# Patient Record
Sex: Female | Born: 1980 | Race: Black or African American | Hispanic: No | Marital: Single | Smoking: Former smoker
Health system: Southern US, Community
[De-identification: ages and names within clinical notes are randomized; demographics above are authoritative.]

## PROBLEM LIST (undated history)

## (undated) DIAGNOSIS — K219 Gastro-esophageal reflux disease without esophagitis: Secondary | ICD-10-CM

## (undated) HISTORY — PX: HEMORRHOID SURGERY: SHX153

---
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
## 2001-09-14 ENCOUNTER — Emergency Department (HOSPITAL_COMMUNITY): Admission: EM | Admit: 2001-09-14 | Discharge: 2001-09-14 | Payer: Self-pay | Admitting: Emergency Medicine

## 2001-12-04 ENCOUNTER — Emergency Department (HOSPITAL_COMMUNITY): Admission: EM | Admit: 2001-12-04 | Discharge: 2001-12-05 | Payer: Self-pay | Admitting: Emergency Medicine

## 2001-12-07 ENCOUNTER — Emergency Department (HOSPITAL_COMMUNITY): Admission: EM | Admit: 2001-12-07 | Discharge: 2001-12-08 | Payer: Self-pay | Admitting: Emergency Medicine

## 2001-12-19 ENCOUNTER — Emergency Department (HOSPITAL_COMMUNITY): Admission: EM | Admit: 2001-12-19 | Discharge: 2001-12-19 | Payer: Self-pay | Admitting: Emergency Medicine

## 2001-12-23 ENCOUNTER — Encounter: Payer: Self-pay | Admitting: Emergency Medicine

## 2001-12-23 ENCOUNTER — Emergency Department (HOSPITAL_COMMUNITY): Admission: EM | Admit: 2001-12-23 | Discharge: 2001-12-23 | Payer: Self-pay | Admitting: Emergency Medicine

## 2002-01-19 ENCOUNTER — Emergency Department (HOSPITAL_COMMUNITY): Admission: EM | Admit: 2002-01-19 | Discharge: 2002-01-19 | Payer: Self-pay | Admitting: Emergency Medicine

## 2003-04-19 ENCOUNTER — Emergency Department (HOSPITAL_COMMUNITY): Admission: EM | Admit: 2003-04-19 | Discharge: 2003-04-19 | Payer: Self-pay | Admitting: Emergency Medicine

## 2003-05-22 ENCOUNTER — Emergency Department (HOSPITAL_COMMUNITY): Admission: EM | Admit: 2003-05-22 | Discharge: 2003-05-22 | Payer: Self-pay | Admitting: Emergency Medicine

## 2003-05-28 ENCOUNTER — Emergency Department (HOSPITAL_COMMUNITY): Admission: EM | Admit: 2003-05-28 | Discharge: 2003-05-28 | Payer: Self-pay | Admitting: Emergency Medicine

## 2003-06-01 ENCOUNTER — Ambulatory Visit (HOSPITAL_COMMUNITY): Admission: RE | Admit: 2003-06-01 | Discharge: 2003-06-01 | Payer: Self-pay | Admitting: Emergency Medicine

## 2003-06-02 ENCOUNTER — Ambulatory Visit (HOSPITAL_COMMUNITY): Admission: RE | Admit: 2003-06-02 | Discharge: 2003-06-02 | Payer: Self-pay | Admitting: Family Medicine

## 2003-07-21 ENCOUNTER — Emergency Department (HOSPITAL_COMMUNITY): Admission: EM | Admit: 2003-07-21 | Discharge: 2003-07-22 | Payer: Self-pay | Admitting: Emergency Medicine

## 2003-10-14 ENCOUNTER — Emergency Department (HOSPITAL_COMMUNITY): Admission: EM | Admit: 2003-10-14 | Discharge: 2003-10-14 | Payer: Self-pay | Admitting: Emergency Medicine

## 2003-10-20 ENCOUNTER — Emergency Department (HOSPITAL_COMMUNITY): Admission: EM | Admit: 2003-10-20 | Discharge: 2003-10-20 | Payer: Self-pay | Admitting: Emergency Medicine

## 2003-12-08 ENCOUNTER — Inpatient Hospital Stay (HOSPITAL_COMMUNITY): Admission: AD | Admit: 2003-12-08 | Discharge: 2003-12-08 | Payer: Self-pay | Admitting: Obstetrics

## 2004-06-27 ENCOUNTER — Emergency Department (HOSPITAL_COMMUNITY): Admission: EM | Admit: 2004-06-27 | Discharge: 2004-06-27 | Payer: Self-pay | Admitting: Emergency Medicine

## 2004-08-05 ENCOUNTER — Emergency Department (HOSPITAL_COMMUNITY): Admission: EM | Admit: 2004-08-05 | Discharge: 2004-08-05 | Payer: Self-pay | Admitting: Emergency Medicine

## 2004-12-02 ENCOUNTER — Encounter: Payer: Self-pay | Admitting: Emergency Medicine

## 2004-12-03 ENCOUNTER — Ambulatory Visit: Payer: Self-pay | Admitting: Obstetrics and Gynecology

## 2004-12-03 ENCOUNTER — Inpatient Hospital Stay (HOSPITAL_COMMUNITY): Admission: AD | Admit: 2004-12-03 | Discharge: 2004-12-04 | Payer: Self-pay | Admitting: Obstetrics and Gynecology

## 2005-02-22 ENCOUNTER — Emergency Department (HOSPITAL_COMMUNITY): Admission: EM | Admit: 2005-02-22 | Discharge: 2005-02-22 | Payer: Self-pay | Admitting: Emergency Medicine

## 2005-04-30 ENCOUNTER — Emergency Department (HOSPITAL_COMMUNITY): Admission: EM | Admit: 2005-04-30 | Discharge: 2005-04-30 | Payer: Self-pay | Admitting: Emergency Medicine

## 2006-01-23 ENCOUNTER — Emergency Department (HOSPITAL_COMMUNITY): Admission: EM | Admit: 2006-01-23 | Discharge: 2006-01-23 | Payer: Self-pay | Admitting: Emergency Medicine

## 2006-02-14 ENCOUNTER — Emergency Department (HOSPITAL_COMMUNITY): Admission: EM | Admit: 2006-02-14 | Discharge: 2006-02-14 | Payer: Self-pay | Admitting: Emergency Medicine

## 2006-08-16 ENCOUNTER — Encounter (INDEPENDENT_AMBULATORY_CARE_PROVIDER_SITE_OTHER): Payer: Self-pay | Admitting: Nurse Practitioner

## 2006-08-23 ENCOUNTER — Ambulatory Visit: Payer: Self-pay | Admitting: Internal Medicine

## 2006-08-23 ENCOUNTER — Encounter (INDEPENDENT_AMBULATORY_CARE_PROVIDER_SITE_OTHER): Payer: Self-pay | Admitting: Nurse Practitioner

## 2006-08-23 LAB — CONVERTED CEMR LAB
Calcium: 8.9 mg/dL
Chloride: 107 meq/L
HCT: 39.4 %
MCHC: 32.7 g/dL
MCV: 90 fL
Platelets: 172 10*3/uL
Potassium: 4.2 meq/L
VLDL: 14 mg/dL

## 2006-09-11 ENCOUNTER — Ambulatory Visit: Payer: Self-pay | Admitting: Internal Medicine

## 2006-09-11 ENCOUNTER — Other Ambulatory Visit: Admission: RE | Admit: 2006-09-11 | Discharge: 2006-09-11 | Payer: Self-pay | Admitting: Family Medicine

## 2006-09-11 ENCOUNTER — Encounter (INDEPENDENT_AMBULATORY_CARE_PROVIDER_SITE_OTHER): Payer: Self-pay | Admitting: Nurse Practitioner

## 2006-09-11 LAB — CONVERTED CEMR LAB
GC Probe Amp, Genital: NEGATIVE
Pap Smear: NEGATIVE

## 2006-09-21 ENCOUNTER — Encounter: Payer: Self-pay | Admitting: Nurse Practitioner

## 2006-09-23 DIAGNOSIS — K219 Gastro-esophageal reflux disease without esophagitis: Secondary | ICD-10-CM

## 2006-10-28 ENCOUNTER — Telehealth (INDEPENDENT_AMBULATORY_CARE_PROVIDER_SITE_OTHER): Payer: Self-pay | Admitting: Nurse Practitioner

## 2006-12-23 ENCOUNTER — Ambulatory Visit: Payer: Self-pay | Admitting: Nurse Practitioner

## 2006-12-23 DIAGNOSIS — S335XXA Sprain of ligaments of lumbar spine, initial encounter: Secondary | ICD-10-CM

## 2007-01-15 ENCOUNTER — Ambulatory Visit: Payer: Self-pay | Admitting: Nurse Practitioner

## 2007-02-11 ENCOUNTER — Emergency Department (HOSPITAL_COMMUNITY): Admission: EM | Admit: 2007-02-11 | Discharge: 2007-02-11 | Payer: Self-pay | Admitting: Emergency Medicine

## 2007-04-21 ENCOUNTER — Emergency Department (HOSPITAL_COMMUNITY): Admission: EM | Admit: 2007-04-21 | Discharge: 2007-04-21 | Payer: Self-pay | Admitting: Emergency Medicine

## 2007-05-28 ENCOUNTER — Ambulatory Visit (HOSPITAL_COMMUNITY): Admission: RE | Admit: 2007-05-28 | Discharge: 2007-05-28 | Payer: Self-pay | Admitting: Internal Medicine

## 2007-06-12 ENCOUNTER — Telehealth (INDEPENDENT_AMBULATORY_CARE_PROVIDER_SITE_OTHER): Payer: Self-pay | Admitting: Nurse Practitioner

## 2007-06-15 ENCOUNTER — Emergency Department (HOSPITAL_COMMUNITY): Admission: EM | Admit: 2007-06-15 | Discharge: 2007-06-15 | Payer: Self-pay | Admitting: Emergency Medicine

## 2007-06-16 ENCOUNTER — Emergency Department (HOSPITAL_COMMUNITY): Admission: EM | Admit: 2007-06-16 | Discharge: 2007-06-17 | Payer: Self-pay | Admitting: Emergency Medicine

## 2007-09-14 ENCOUNTER — Emergency Department (HOSPITAL_COMMUNITY): Admission: EM | Admit: 2007-09-14 | Discharge: 2007-09-14 | Payer: Self-pay | Admitting: Emergency Medicine

## 2007-09-25 ENCOUNTER — Ambulatory Visit: Payer: Self-pay | Admitting: Nurse Practitioner

## 2007-09-25 DIAGNOSIS — L910 Hypertrophic scar: Secondary | ICD-10-CM

## 2007-12-10 ENCOUNTER — Emergency Department (HOSPITAL_COMMUNITY): Admission: EM | Admit: 2007-12-10 | Discharge: 2007-12-10 | Payer: Self-pay | Admitting: Family Medicine

## 2007-12-18 ENCOUNTER — Encounter (INDEPENDENT_AMBULATORY_CARE_PROVIDER_SITE_OTHER): Payer: Self-pay | Admitting: Nurse Practitioner

## 2007-12-18 ENCOUNTER — Ambulatory Visit: Payer: Self-pay | Admitting: Nurse Practitioner

## 2007-12-18 ENCOUNTER — Other Ambulatory Visit: Admission: RE | Admit: 2007-12-18 | Discharge: 2007-12-18 | Payer: Self-pay | Admitting: Internal Medicine

## 2007-12-18 LAB — CONVERTED CEMR LAB
Albumin: 4.5 g/dL (ref 3.5–5.2)
Alkaline Phosphatase: 46 units/L (ref 39–117)
BUN: 7 mg/dL (ref 6–23)
Basophils Relative: 0 % (ref 0–1)
Beta hcg, urine, semiquantitative: NEGATIVE
Bilirubin Urine: NEGATIVE
Blood in Urine, dipstick: NEGATIVE
CO2: 23 meq/L (ref 19–32)
Calcium: 9.6 mg/dL (ref 8.4–10.5)
Chlamydia, DNA Probe: NEGATIVE
Chloride: 104 meq/L (ref 96–112)
Cholesterol: 185 mg/dL (ref 0–200)
Creatinine, Ser: 0.76 mg/dL (ref 0.40–1.20)
Glucose, Bld: 95 mg/dL (ref 70–99)
Glucose, Urine, Semiquant: NEGATIVE
HDL: 40 mg/dL (ref >39)
Hemoglobin: 13.5 g/dL (ref 12.0–15.0)
Ketones, urine, test strip: NEGATIVE
Lymphs Abs: 2.1 10*3/uL (ref 0.7–4.0)
MCV: 86.7 fL (ref 78.0–100.0)
Potassium: 4.2 meq/L (ref 3.5–5.3)
RBC: 4.59 M/uL (ref 3.87–5.11)
Sodium: 138 meq/L (ref 135–145)
Specific Gravity, Urine: 1.015
Total Bilirubin: 0.3 mg/dL (ref 0.3–1.2)
Total CHOL/HDL Ratio: 4.6
Total Protein: 7.6 g/dL (ref 6.0–8.3)
Urobilinogen, UA: 0.2

## 2007-12-19 ENCOUNTER — Encounter (INDEPENDENT_AMBULATORY_CARE_PROVIDER_SITE_OTHER): Payer: Self-pay | Admitting: Nurse Practitioner

## 2007-12-30 ENCOUNTER — Encounter (INDEPENDENT_AMBULATORY_CARE_PROVIDER_SITE_OTHER): Payer: Self-pay | Admitting: *Deleted

## 2008-01-05 ENCOUNTER — Telehealth (INDEPENDENT_AMBULATORY_CARE_PROVIDER_SITE_OTHER): Payer: Self-pay | Admitting: Nurse Practitioner

## 2008-01-06 ENCOUNTER — Emergency Department (HOSPITAL_COMMUNITY): Admission: EM | Admit: 2008-01-06 | Discharge: 2008-01-06 | Payer: Self-pay | Admitting: Family Medicine

## 2008-01-21 ENCOUNTER — Emergency Department (HOSPITAL_COMMUNITY): Admission: EM | Admit: 2008-01-21 | Discharge: 2008-01-21 | Payer: Self-pay | Admitting: Emergency Medicine

## 2008-03-01 ENCOUNTER — Emergency Department (HOSPITAL_COMMUNITY): Admission: EM | Admit: 2008-03-01 | Discharge: 2008-03-01 | Payer: Self-pay | Admitting: Emergency Medicine

## 2008-05-04 ENCOUNTER — Emergency Department (HOSPITAL_COMMUNITY): Admission: EM | Admit: 2008-05-04 | Discharge: 2008-05-04 | Payer: Self-pay | Admitting: Family Medicine

## 2008-06-11 ENCOUNTER — Telehealth (INDEPENDENT_AMBULATORY_CARE_PROVIDER_SITE_OTHER): Payer: Self-pay | Admitting: Nurse Practitioner

## 2008-06-17 ENCOUNTER — Ambulatory Visit: Payer: Self-pay | Admitting: Nurse Practitioner

## 2008-06-17 DIAGNOSIS — F329 Major depressive disorder, single episode, unspecified: Secondary | ICD-10-CM | POA: Insufficient documentation

## 2008-06-23 ENCOUNTER — Encounter (INDEPENDENT_AMBULATORY_CARE_PROVIDER_SITE_OTHER): Payer: Self-pay | Admitting: Nurse Practitioner

## 2008-09-16 ENCOUNTER — Emergency Department (HOSPITAL_COMMUNITY): Admission: EM | Admit: 2008-09-16 | Discharge: 2008-09-16 | Payer: Self-pay | Admitting: Family Medicine

## 2008-09-23 ENCOUNTER — Inpatient Hospital Stay (HOSPITAL_COMMUNITY): Admission: AD | Admit: 2008-09-23 | Discharge: 2008-09-23 | Payer: Self-pay | Admitting: Obstetrics & Gynecology

## 2008-12-23 ENCOUNTER — Telehealth (INDEPENDENT_AMBULATORY_CARE_PROVIDER_SITE_OTHER): Payer: Self-pay | Admitting: Nurse Practitioner

## 2009-01-13 ENCOUNTER — Emergency Department (HOSPITAL_COMMUNITY): Admission: EM | Admit: 2009-01-13 | Discharge: 2009-01-13 | Payer: Self-pay | Admitting: Emergency Medicine

## 2009-01-18 ENCOUNTER — Encounter (INDEPENDENT_AMBULATORY_CARE_PROVIDER_SITE_OTHER): Payer: Self-pay | Admitting: *Deleted

## 2009-03-26 ENCOUNTER — Emergency Department (HOSPITAL_COMMUNITY): Admission: EM | Admit: 2009-03-26 | Discharge: 2009-03-26 | Payer: Self-pay | Admitting: Emergency Medicine

## 2009-05-25 ENCOUNTER — Emergency Department (HOSPITAL_COMMUNITY): Admission: EM | Admit: 2009-05-25 | Discharge: 2009-05-26 | Payer: Self-pay | Admitting: Emergency Medicine

## 2009-07-13 ENCOUNTER — Emergency Department (HOSPITAL_COMMUNITY): Admission: EM | Admit: 2009-07-13 | Discharge: 2009-07-14 | Payer: Self-pay | Admitting: Emergency Medicine

## 2009-07-28 ENCOUNTER — Emergency Department (HOSPITAL_COMMUNITY): Admission: EM | Admit: 2009-07-28 | Discharge: 2009-07-28 | Payer: Self-pay | Admitting: Emergency Medicine

## 2009-08-03 ENCOUNTER — Emergency Department (HOSPITAL_COMMUNITY): Admission: EM | Admit: 2009-08-03 | Discharge: 2009-08-03 | Payer: Self-pay | Admitting: Emergency Medicine

## 2009-08-07 ENCOUNTER — Emergency Department (HOSPITAL_COMMUNITY): Admission: EM | Admit: 2009-08-07 | Discharge: 2009-08-08 | Payer: Self-pay | Admitting: Emergency Medicine

## 2010-02-06 ENCOUNTER — Emergency Department (HOSPITAL_COMMUNITY)
Admission: EM | Admit: 2010-02-06 | Discharge: 2010-02-07 | Payer: Self-pay | Source: Home / Self Care | Admitting: Emergency Medicine

## 2010-03-05 ENCOUNTER — Encounter: Payer: Self-pay | Admitting: Emergency Medicine

## 2010-04-24 LAB — CBC
HCT: 35.1 % — ABNORMAL LOW (ref 36.0–46.0)
MCH: 26.5 pg (ref 26.0–34.0)
MCV: 81.6 fL (ref 78.0–100.0)
Platelets: 193 10*3/uL (ref 150–400)
RBC: 4.3 MIL/uL (ref 3.87–5.11)
WBC: 6.2 10*3/uL (ref 4.0–10.5)

## 2010-04-24 LAB — BASIC METABOLIC PANEL
CO2: 26 mEq/L (ref 19–32)
Calcium: 9.2 mg/dL (ref 8.4–10.5)
Chloride: 104 mEq/L (ref 96–112)
Glucose, Bld: 96 mg/dL (ref 70–99)

## 2010-04-24 LAB — URINALYSIS, ROUTINE W REFLEX MICROSCOPIC
Glucose, UA: NEGATIVE mg/dL
Hgb urine dipstick: NEGATIVE

## 2010-04-24 LAB — DIFFERENTIAL
Basophils Absolute: 0 10*3/uL (ref 0.0–0.1)
Basophils Relative: 0 % (ref 0–1)
Eosinophils Relative: 1 % (ref 0–5)
Lymphs Abs: 1.8 10*3/uL (ref 0.7–4.0)
Monocytes Absolute: 0.5 10*3/uL (ref 0.1–1.0)
Monocytes Relative: 8 % (ref 3–12)
Neutro Abs: 3.9 10*3/uL (ref 1.7–7.7)
Neutrophils Relative %: 62 % (ref 43–77)

## 2010-05-01 LAB — URINALYSIS, ROUTINE W REFLEX MICROSCOPIC
Ketones, ur: NEGATIVE mg/dL
Ketones, ur: NEGATIVE mg/dL
Nitrite: NEGATIVE
Protein, ur: NEGATIVE mg/dL
Protein, ur: NEGATIVE mg/dL
pH: 5.5 (ref 5.0–8.0)

## 2010-05-01 LAB — COMPREHENSIVE METABOLIC PANEL
ALT: 11 U/L (ref 0–35)
Calcium: 9.2 mg/dL (ref 8.4–10.5)
Creatinine, Ser: 0.94 mg/dL (ref 0.4–1.2)
GFR calc Af Amer: >60 mL/min (ref >60)
Glucose, Bld: 88 mg/dL (ref 70–99)
Sodium: 139 mEq/L (ref 135–145)
Total Protein: 7 g/dL (ref 6.0–8.3)

## 2010-05-01 LAB — HEMOCCULT GUIAC POC 1CARD (OFFICE): Fecal Occult Bld: POSITIVE

## 2010-05-01 LAB — DIFFERENTIAL
Eosinophils Absolute: 0.1 10*3/uL (ref 0.0–0.7)
Lymphocytes Relative: 23 % (ref 12–46)
Lymphs Abs: 1.6 10*3/uL (ref 0.7–4.0)
Monocytes Relative: 9 % (ref 3–12)
Neutrophils Relative %: 67 % (ref 43–77)

## 2010-05-01 LAB — CBC
MCHC: 33.5 g/dL (ref 30.0–36.0)
MCV: 91.6 fL (ref 78.0–100.0)
RDW: 13.6 % (ref 11.5–15.5)

## 2010-05-01 LAB — POCT PREGNANCY, URINE: Preg Test, Ur: NEGATIVE

## 2010-05-03 LAB — DIFFERENTIAL
Basophils Absolute: 0.1 10*3/uL (ref 0.0–0.1)
Basophils Relative: 2 % — ABNORMAL HIGH (ref 0–1)
Lymphocytes Relative: 19 % (ref 12–46)
Monocytes Absolute: 0.6 10*3/uL (ref 0.1–1.0)
Monocytes Relative: 9 % (ref 3–12)
Neutro Abs: 5 10*3/uL (ref 1.7–7.7)
Neutrophils Relative %: 70 % (ref 43–77)

## 2010-05-03 LAB — URINALYSIS, ROUTINE W REFLEX MICROSCOPIC
Glucose, UA: NEGATIVE mg/dL
Ketones, ur: 15 mg/dL — AB
Leukocytes, UA: NEGATIVE
Nitrite: NEGATIVE
Specific Gravity, Urine: 1.03 (ref 1.005–1.030)
pH: 6.5 (ref 5.0–8.0)

## 2010-05-03 LAB — POCT PREGNANCY, URINE: Preg Test, Ur: NEGATIVE

## 2010-05-03 LAB — ETHANOL: Alcohol, Ethyl (B): <5 mg/dL (ref 0–10)

## 2010-05-03 LAB — ACETAMINOPHEN LEVEL
Acetaminophen (Tylenol), Serum: <10 ug/mL — ABNORMAL LOW (ref 10–30)
Acetaminophen (Tylenol), Serum: <10 ug/mL — ABNORMAL LOW (ref 10–30)

## 2010-05-03 LAB — BASIC METABOLIC PANEL
Calcium: 9.3 mg/dL (ref 8.4–10.5)
Creatinine, Ser: 1.01 mg/dL (ref 0.4–1.2)
GFR calc Af Amer: >60 mL/min (ref >60)
GFR calc non Af Amer: >60 mL/min (ref >60)
Sodium: 139 mEq/L (ref 135–145)

## 2010-05-03 LAB — POCT I-STAT, CHEM 8
Creatinine, Ser: 1 mg/dL (ref 0.4–1.2)
HCT: 39 % (ref 36.0–46.0)
Hemoglobin: 13.3 g/dL (ref 12.0–15.0)
Potassium: 3.5 mEq/L (ref 3.5–5.1)
Sodium: 139 mEq/L (ref 135–145)
TCO2: 23 mmol/L (ref 0–100)

## 2010-05-03 LAB — RAPID URINE DRUG SCREEN, HOSP PERFORMED
Amphetamines: NOT DETECTED
Tetrahydrocannabinol: POSITIVE — AB

## 2010-05-03 LAB — CBC
Hemoglobin: 12.8 g/dL (ref 12.0–15.0)
MCHC: 33 g/dL (ref 30.0–36.0)
RBC: 4.26 MIL/uL (ref 3.87–5.11)
RDW: 12.2 % (ref 11.5–15.5)

## 2010-05-03 LAB — URINE MICROSCOPIC-ADD ON

## 2010-05-11 ENCOUNTER — Emergency Department (HOSPITAL_COMMUNITY)
Admission: EM | Admit: 2010-05-11 | Discharge: 2010-05-11 | Disposition: A | Discharge status: Home / Self Care | Payer: Self-pay | Attending: Emergency Medicine | Admitting: Emergency Medicine

## 2010-05-11 DIAGNOSIS — S91009A Unspecified open wound, unspecified ankle, initial encounter: Secondary | ICD-10-CM | POA: Insufficient documentation

## 2010-05-11 DIAGNOSIS — S81009A Unspecified open wound, unspecified knee, initial encounter: Secondary | ICD-10-CM | POA: Insufficient documentation

## 2010-05-11 DIAGNOSIS — Y9289 Other specified places as the place of occurrence of the external cause: Secondary | ICD-10-CM | POA: Insufficient documentation

## 2010-05-11 DIAGNOSIS — W540XXA Bitten by dog, initial encounter: Secondary | ICD-10-CM | POA: Insufficient documentation

## 2010-05-11 DIAGNOSIS — IMO0002 Reserved for concepts with insufficient information to code with codable children: Secondary | ICD-10-CM | POA: Insufficient documentation

## 2010-05-11 DIAGNOSIS — M79609 Pain in unspecified limb: Secondary | ICD-10-CM | POA: Insufficient documentation

## 2010-05-16 LAB — URINALYSIS, ROUTINE W REFLEX MICROSCOPIC
Bilirubin Urine: NEGATIVE
Ketones, ur: NEGATIVE mg/dL
Leukocytes, UA: NEGATIVE
Nitrite: NEGATIVE
Protein, ur: 30 mg/dL — AB
Urobilinogen, UA: 1 mg/dL (ref 0.0–1.0)

## 2010-05-16 LAB — URINE CULTURE: Colony Count: >=100000

## 2010-05-16 LAB — GC/CHLAMYDIA PROBE AMP, URINE: GC Probe Amp, Urine: NEGATIVE

## 2010-05-20 LAB — GC/CHLAMYDIA PROBE AMP, GENITAL: GC Probe Amp, Genital: NEGATIVE

## 2010-05-20 LAB — URINALYSIS, ROUTINE W REFLEX MICROSCOPIC
Bilirubin Urine: NEGATIVE
Glucose, UA: NEGATIVE mg/dL
Ketones, ur: NEGATIVE mg/dL
Leukocytes, UA: NEGATIVE
pH: 6 (ref 5.0–8.0)

## 2010-05-20 LAB — COMPREHENSIVE METABOLIC PANEL
ALT: 11 U/L (ref 0–35)
AST: 16 U/L (ref 0–37)
Alkaline Phosphatase: 40 U/L (ref 39–117)
CO2: 25 mEq/L (ref 19–32)
Calcium: 9.2 mg/dL (ref 8.4–10.5)
Chloride: 106 mEq/L (ref 96–112)
GFR calc Af Amer: >60 mL/min (ref >60)
GFR calc non Af Amer: >60 mL/min (ref >60)
Glucose, Bld: 97 mg/dL (ref 70–99)
Potassium: 4.3 mEq/L (ref 3.5–5.1)
Sodium: 135 mEq/L (ref 135–145)

## 2010-05-20 LAB — WET PREP, GENITAL: Trich, Wet Prep: NONE SEEN

## 2010-05-20 LAB — URINE MICROSCOPIC-ADD ON

## 2010-05-29 LAB — RPR: RPR Ser Ql: NONREACTIVE

## 2010-05-29 LAB — POCT I-STAT, CHEM 8
Calcium, Ion: 1.17 mmol/L (ref 1.12–1.32)
Glucose, Bld: 80 mg/dL (ref 70–99)
HCT: 37 % (ref 36.0–46.0)
Hemoglobin: 12.6 g/dL (ref 12.0–15.0)
Potassium: 4.4 mEq/L (ref 3.5–5.1)

## 2010-05-29 LAB — CBC
Platelets: 238 10*3/uL (ref 150–400)
RDW: 13.2 % (ref 11.5–15.5)
WBC: 6.2 10*3/uL (ref 4.0–10.5)

## 2010-05-29 LAB — URINALYSIS, ROUTINE W REFLEX MICROSCOPIC
Ketones, ur: NEGATIVE mg/dL
Nitrite: NEGATIVE
Protein, ur: NEGATIVE mg/dL
pH: 5.5 (ref 5.0–8.0)

## 2010-05-29 LAB — DIFFERENTIAL
Basophils Relative: 1 % (ref 0–1)
Eosinophils Absolute: 0.1 10*3/uL (ref 0.0–0.7)
Eosinophils Relative: 1 % (ref 0–5)
Monocytes Absolute: 0.5 10*3/uL (ref 0.1–1.0)
Monocytes Relative: 9 % (ref 3–12)
Neutro Abs: 4.1 10*3/uL (ref 1.7–7.7)

## 2010-05-29 LAB — COMPREHENSIVE METABOLIC PANEL
ALT: 12 U/L (ref 0–35)
AST: 17 U/L (ref 0–37)
Albumin: 3.6 g/dL (ref 3.5–5.2)
Alkaline Phosphatase: 39 U/L (ref 39–117)
GFR calc Af Amer: >60 mL/min (ref >60)
Potassium: 4.4 mEq/L (ref 3.5–5.1)
Sodium: 137 mEq/L (ref 135–145)
Total Protein: 6.9 g/dL (ref 6.0–8.3)

## 2010-05-29 LAB — POCT PREGNANCY, URINE: Preg Test, Ur: NEGATIVE

## 2010-06-30 NOTE — Discharge Summary (Signed)
[name], [name]              ACCOUNT NO.:  [id number]   MEDICAL RECORD NO.:  [id number]          PATIENT TYPE:  INP   LOCATION:  9306                          FACILITY:  [facility]   PHYSICIAN:  Tanya S. Shawnie Pons, M.D.   DATE OF BIRTH:  1981-01-26   DATE OF ADMISSION:  12/03/2004  DATE OF DISCHARGE:  12/04/2004                                 DISCHARGE SUMMARY   ADMIT DIAGNOSES:  Left lower quadrant pain, possible pelvic inflammatory  disease versus tubo-ovarian abscess.   DISCHARGE DIAGNOSES:  1.  Right hydrosalpinx.  2.  Complex left ovary.  3.  Pelvic inflammatory disease secondary to bacterial vaginosis.   PROCEDURES:  1.  Transvaginal ultrasound on December 02, 2004.  2.  Complete pelvic ultrasound on December 02, 2004.   HOSPITAL COURSE:  In brief, Ms. Carrie Cobb is a 30 year old African-American  female P1-0-0-1 with her last menstrual period approximately one month ago  who presented to [facility] Emergency Department for increase in left lower  quadrant pain x2 days as well as nausea.  Patient also reported subjective  fevers and chills.  She denied any dysuria, but did state that she had  dyspareunia yesterday.  She was evaluated in [facility] Emergency  Department and found to have a white count of 15 as well as transvaginal  ultrasound that showed right hydrosalpinx as well as a left ovarian cystic  mass measuring approximately 3.5 cm in diameter.  She was transferred to  [facility] due to inability to tolerate p.o. pain medicine.  Throughout her course here the patient was initially placed on Rocephin as  well as doxycycline for possible pelvic inflammatory disease versus tubo-  ovarian abscess.  Prior to discharge the patient was afebrile and white  count had come down to 8.3 with 80% neutrophils.   LABORATORIES:  Urine hCG was negative.  Urinalysis:  Specific gravity of  1.02, negative nitrites and leukocytes.  Wet prep negative except for clue  cells present.  GC  and Chlamydia were negative.  Transvaginal ultrasound  which revealed a retroflexed uterus, right hydrosalpinx, as well as a  complex-appearing left ovary which has a cystic portion that measures 3.5 cm  in diameter.  When compared to the CT from June 2005 they suggest that this  may be a long-standing problem and would recommend follow-up pelvic  ultrasound in six to eight weeks and if the problem persists they also  recommend laparoscopic evaluation of the left adnexal abnormality.   DISCHARGE MEDICATIONS:  1.  Ibuprofen 600 mg tablets p.o. q.4-6h. as needed pain, dispensed #20.  2.  Percocet 5/325 mg one tablet p.o. q.4-6h. as needed pain, dispensed #20.  3.  Doxycycline 100 mg tablet p.o. b.i.d. for 10 days.  4.  Metronidazole 500 mg tablet p.o. b.i.d. for 10 days.   Patient was instructed to follow up at the gynecology clinic at [facility] with either Dr. Okey Dupre or Dr. Penne Lash on December 26, 2004 at 1:30 p.m.  and given the number [phone].  She was instructed to return to the  emergency department or [facility] for  fever, inability to take oral  fluids, nausea, vomiting, or worsening abdominal pain.      Alanson Puls, M.D.    ______________________________  Shelbie Proctor. Shawnie Pons, M.D.    MR/MEDQ  D:  12/04/2004  T:  12/04/2004  Job:  [id number]

## 2010-11-14 LAB — POCT URINALYSIS DIP (DEVICE)
Glucose, UA: NEGATIVE
Specific Gravity, Urine: 1.02
Urobilinogen, UA: 1

## 2010-11-17 LAB — POCT I-STAT, CHEM 8
BUN: 9 mg/dL (ref 6–23)
Creatinine, Ser: 1 mg/dL (ref 0.4–1.2)
Sodium: 140 mEq/L (ref 135–145)
TCO2: 26 mmol/L (ref 0–100)

## 2010-12-04 ENCOUNTER — Emergency Department (HOSPITAL_COMMUNITY)
Admission: EM | Admit: 2010-12-04 | Discharge: 2010-12-05 | Disposition: A | Discharge status: Home / Self Care | Payer: 59 | Attending: Emergency Medicine | Admitting: Emergency Medicine

## 2010-12-04 DIAGNOSIS — R112 Nausea with vomiting, unspecified: Secondary | ICD-10-CM | POA: Insufficient documentation

## 2010-12-04 DIAGNOSIS — R109 Unspecified abdominal pain: Secondary | ICD-10-CM | POA: Insufficient documentation

## 2010-12-04 DIAGNOSIS — N7013 Chronic salpingitis and oophoritis: Secondary | ICD-10-CM | POA: Insufficient documentation

## 2010-12-04 DIAGNOSIS — R10819 Abdominal tenderness, unspecified site: Secondary | ICD-10-CM | POA: Insufficient documentation

## 2010-12-04 DIAGNOSIS — R630 Anorexia: Secondary | ICD-10-CM | POA: Insufficient documentation

## 2010-12-05 ENCOUNTER — Emergency Department (HOSPITAL_COMMUNITY): Payer: 59

## 2010-12-05 LAB — CBC
HCT: 36 % (ref 36.0–46.0)
MCV: 83.3 fL (ref 78.0–100.0)
RBC: 4.32 MIL/uL (ref 3.87–5.11)
WBC: 9.3 10*3/uL (ref 4.0–10.5)

## 2010-12-05 LAB — URINALYSIS, ROUTINE W REFLEX MICROSCOPIC
Glucose, UA: NEGATIVE mg/dL
Ketones, ur: 15 mg/dL — AB
Leukocytes, UA: NEGATIVE
pH: 5.5 (ref 5.0–8.0)

## 2010-12-05 LAB — COMPREHENSIVE METABOLIC PANEL
AST: 13 U/L (ref 0–37)
Alkaline Phosphatase: 47 U/L (ref 39–117)
BUN: 12 mg/dL (ref 6–23)
CO2: 25 mEq/L (ref 19–32)
Chloride: 102 mEq/L (ref 96–112)
Creatinine, Ser: 0.73 mg/dL (ref 0.50–1.10)
GFR calc non Af Amer: >90 mL/min (ref >90)
Total Bilirubin: 0.2 mg/dL — ABNORMAL LOW (ref 0.3–1.2)

## 2010-12-05 LAB — DIFFERENTIAL
Lymphocytes Relative: 20 % (ref 12–46)
Lymphs Abs: 1.9 10*3/uL (ref 0.7–4.0)
Neutro Abs: 6.5 10*3/uL (ref 1.7–7.7)
Neutrophils Relative %: 71 % (ref 43–77)

## 2010-12-05 LAB — LIPASE, BLOOD: Lipase: 22 U/L (ref 11–59)

## 2010-12-05 MED ORDER — IOHEXOL 300 MG/ML  SOLN
80.0000 mL | Freq: Once | INTRAMUSCULAR | Status: AC | PRN
  Administered 2010-12-05: 80 mL via INTRAVENOUS

## 2010-12-06 LAB — GC/CHLAMYDIA PROBE AMP, GENITAL: GC Probe Amp, Genital: NEGATIVE

## 2010-12-21 ENCOUNTER — Emergency Department (HOSPITAL_COMMUNITY)
Admission: EM | Admit: 2010-12-21 | Discharge: 2010-12-22 | Disposition: A | Discharge status: Home / Self Care | Payer: 59 | Attending: Emergency Medicine | Admitting: Emergency Medicine

## 2010-12-21 ENCOUNTER — Encounter: Payer: Self-pay | Admitting: Emergency Medicine

## 2010-12-21 DIAGNOSIS — N760 Acute vaginitis: Secondary | ICD-10-CM | POA: Insufficient documentation

## 2010-12-21 DIAGNOSIS — M549 Dorsalgia, unspecified: Secondary | ICD-10-CM | POA: Insufficient documentation

## 2010-12-21 DIAGNOSIS — F17200 Nicotine dependence, unspecified, uncomplicated: Secondary | ICD-10-CM | POA: Insufficient documentation

## 2010-12-21 DIAGNOSIS — B9689 Other specified bacterial agents as the cause of diseases classified elsewhere: Secondary | ICD-10-CM | POA: Insufficient documentation

## 2010-12-21 DIAGNOSIS — N949 Unspecified condition associated with female genital organs and menstrual cycle: Secondary | ICD-10-CM | POA: Insufficient documentation

## 2010-12-21 DIAGNOSIS — N7011 Chronic salpingitis: Secondary | ICD-10-CM | POA: Diagnosis present

## 2010-12-21 DIAGNOSIS — N7013 Chronic salpingitis and oophoritis: Secondary | ICD-10-CM | POA: Insufficient documentation

## 2010-12-21 DIAGNOSIS — R109 Unspecified abdominal pain: Secondary | ICD-10-CM | POA: Insufficient documentation

## 2010-12-21 DIAGNOSIS — R10819 Abdominal tenderness, unspecified site: Secondary | ICD-10-CM | POA: Insufficient documentation

## 2010-12-21 DIAGNOSIS — A499 Bacterial infection, unspecified: Secondary | ICD-10-CM | POA: Insufficient documentation

## 2010-12-21 DIAGNOSIS — R102 Pelvic and perineal pain: Secondary | ICD-10-CM

## 2010-12-21 LAB — POCT PREGNANCY, URINE: Preg Test, Ur: NEGATIVE

## 2010-12-22 ENCOUNTER — Emergency Department (HOSPITAL_COMMUNITY): Payer: 59

## 2010-12-22 DIAGNOSIS — N7011 Chronic salpingitis: Secondary | ICD-10-CM | POA: Diagnosis present

## 2010-12-22 LAB — URINALYSIS, ROUTINE W REFLEX MICROSCOPIC
Bilirubin Urine: NEGATIVE
Glucose, UA: NEGATIVE mg/dL
Hgb urine dipstick: NEGATIVE
Ketones, ur: NEGATIVE mg/dL
Protein, ur: NEGATIVE mg/dL

## 2010-12-22 LAB — CBC
HCT: 34.7 % — ABNORMAL LOW (ref 36.0–46.0)
Hemoglobin: 11.4 g/dL — ABNORMAL LOW (ref 12.0–15.0)
RBC: 4.03 MIL/uL (ref 3.87–5.11)
RDW: 12.8 % (ref 11.5–15.5)
WBC: 7.8 10*3/uL (ref 4.0–10.5)

## 2010-12-22 LAB — DIFFERENTIAL
Basophils Absolute: 0 10*3/uL (ref 0.0–0.1)
Lymphocytes Relative: 33 % (ref 12–46)
Monocytes Absolute: 0.7 10*3/uL (ref 0.1–1.0)
Neutro Abs: 4.4 10*3/uL (ref 1.7–7.7)

## 2010-12-22 LAB — BASIC METABOLIC PANEL
CO2: 28 mEq/L (ref 19–32)
Chloride: 100 mEq/L (ref 96–112)
Creatinine, Ser: 0.74 mg/dL (ref 0.50–1.10)

## 2010-12-22 LAB — WET PREP, GENITAL: Yeast Wet Prep HPF POC: NONE SEEN

## 2010-12-22 LAB — GC/CHLAMYDIA PROBE AMP, GENITAL: GC Probe Amp, Genital: NEGATIVE

## 2010-12-22 MED ORDER — AZITHROMYCIN 1 G PO PACK: 1.0000 g | PACK | Freq: Once | ORAL | Status: DC

## 2010-12-22 MED ORDER — KETOROLAC TROMETHAMINE 30 MG/ML IJ SOLN
30.0000 mg | Freq: Once | INTRAMUSCULAR | Status: AC
  Administered 2010-12-22: 30 mg via INTRAVENOUS
  Filled 2010-12-22: qty 1

## 2010-12-22 MED ORDER — OXYCODONE-ACETAMINOPHEN 5-325 MG PO TABS: 2.0000 | ORAL_TABLET | ORAL | Status: AC | PRN

## 2010-12-22 MED ORDER — ONDANSETRON HCL 4 MG PO TABS: 4.0000 mg | ORAL_TABLET | Freq: Four times a day (QID) | ORAL | Status: AC

## 2010-12-22 MED ORDER — METRONIDAZOLE 0.75 % VA GEL: 1.0000 | Freq: Two times a day (BID) | VAGINAL | Status: AC

## 2010-12-22 MED ORDER — IBUPROFEN 800 MG PO TABS: 800.0000 mg | ORAL_TABLET | Freq: Three times a day (TID) | ORAL | Status: AC

## 2010-12-22 MED ORDER — AZITHROMYCIN 250 MG PO TABS
1000.0000 mg | ORAL_TABLET | Freq: Once | ORAL | Status: AC
  Administered 2010-12-22: 1000 mg via ORAL
  Filled 2010-12-22: qty 4

## 2010-12-22 NOTE — ED Notes (Signed)
-   pt appears tol pelvic exams.

## 2010-12-22 NOTE — ED Notes (Signed)
Patient stable and being discharged home. md came and spoke with patient.  Got patient a note for work.  Patient stable

## 2010-12-22 NOTE — ED Notes (Signed)
-   pt appears resting comfy.  States pain to abd is a 8/10;   However, appears unbothered.  Continue to monitor.

## 2010-12-22 NOTE — ED Provider Notes (Addendum)
History     CSN: [id number] Arrival date & time: 12/21/2010 10:26 PM   First MD Initiated Contact with Patient 12/21/10 2307      Chief Complaint  Patient presents with  . Abdominal Pain  . Back Pain    (Consider location/radiation/quality/duration/timing/severity/associated sxs/prior treatment) HPI 30 year old female presents to emergency room with continued abdominal pain. Patient was seen on 1025 at [facility] for periumbilical and upper abdominal pain. At that point time patient was found on CT scan to have a slight swelling to her right fallopian tube. Patient was placed on Doxy. Patient was found to be positive for Chlamydia patient has not been taking Doxy as prescribed and still has some tablets left over. She denies any fevers nausea vomiting. She reports the pain has gotten worse and is now left greater than right with some radiation into her back. Patient reports she has a new sexual partner does not use condoms because they are in a "committed relationship" History reviewed. No pertinent past medical history.  Past Surgical History  Procedure Date  . Hemorrhoid surgery     History reviewed. No pertinent family history.  History  Substance Use Topics  . Smoking status: Current Everyday Smoker -- 0.5 packs/day  . Smokeless tobacco: Not on file  . Alcohol Use: No    OB History    Grav Para Term Preterm Abortions TAB SAB Ect Mult Living                  Review of Systems  Gastrointestinal: Positive for abdominal pain.  All other systems reviewed and are negative.    Allergies  Review of patient's allergies indicates no known allergies.  Home Medications   Current Outpatient Rx  Name Route Sig Dispense Refill  . DOXYCYCLINE HYCLATE 100 MG PO TABS Oral Take 100 mg by mouth 2 (two) times daily. Patient is to take for 14 days has  Missed some doses so she still has tablets left       BP 116/66  Pulse 69  Temp(Src) 98.1 F (36.7 C) (Oral)  Resp 18  SpO2  100%  Physical Exam  Nursing note and vitals reviewed. Constitutional: She is oriented to person, place, and time. She appears well-developed and well-nourished.  HENT:  Head: Normocephalic and atraumatic.  Nose: Nose normal.  Mouth/Throat: Oropharynx is clear and moist.  Eyes: Conjunctivae and EOM are normal. Pupils are equal, round, and reactive to light.  Neck: Normal range of motion. Neck supple. No JVD present. No tracheal deviation present. No thyromegaly present.  Cardiovascular: Normal rate, regular rhythm, normal heart sounds and intact distal pulses.  Exam reveals no gallop and no friction rub.   No murmur heard. Pulmonary/Chest: Effort normal and breath sounds normal. No stridor. No respiratory distress. She has no wheezes. She has no rales. She exhibits no tenderness.  Abdominal: Soft. Bowel sounds are normal. She exhibits no distension and no mass. There is tenderness. There is no rebound and no guarding.       Tenderness to palpation and left lower quadrant and periumbilical. Patient noted to have small umbilical hernia easily reducible  Genitourinary: Uterus normal. Pelvic exam was performed with patient prone. There is no rash, tenderness, lesion or injury on the right labia. There is no rash, tenderness, lesion or injury on the left labia. Cervix exhibits motion tenderness and discharge. Right adnexum displays no mass, no tenderness and no fullness. Left adnexum displays tenderness. Left adnexum displays no mass and no fullness.  No erythema, tenderness or bleeding around the vagina. No foreign body around the vagina. No signs of injury around the vagina. Vaginal discharge found.       White frothy discharge noted  Musculoskeletal: Normal range of motion. She exhibits no edema and no tenderness.  Lymphadenopathy:    She has no cervical adenopathy.  Neurological: She is oriented to person, place, and time. She has normal reflexes. No cranial nerve deficit. She exhibits normal  muscle tone. Coordination normal.  Skin: Skin is dry. No rash noted. No erythema. No pallor.  Psychiatric: She has a normal mood and affect. Her behavior is normal. Judgment and thought content normal.    ED Course  Procedures (including critical care time)  Labs Reviewed  CBC - Abnormal; Notable for the following:    Hemoglobin 11.4 (*)    HCT 34.7 (*)    All other components within normal limits  BASIC METABOLIC PANEL - Abnormal; Notable for the following:    Sodium 134 (*)    All other components within normal limits  PREGNANCY, URINE  URINALYSIS, ROUTINE W REFLEX MICROSCOPIC  DIFFERENTIAL  POCT PREGNANCY, URINE  WET PREP, GENITAL  GC/CHLAMYDIA PROBE AMP, GENITAL      MDM  30 year old female with left lower quadrant pain 2 weeks after being treated for possible PID. Patient was positive for Chlamydia at that time. Patient now reports she has not had her partner be treated because he was not the one that gave her the Chlamydia. She doesn't think that she's had sex with her boyfriend since being treated or diagnosed with Chlamydia. Patient read treated for Chlamydia with Zithromax as she has not been compliant with her doxycycline. We'll get ultrasound of adnexa for possible tubo-ovarian abscess. CBC normal pain minimal on exam. Patient retested for gonorrhea and Chlamydia        Olivia Mackie, MD 12/22/10 0051  2:58 AM U/s Shows right hydrosalpinx as seen before. No pain on the right side. Will give patient outpatient gynecology resources. Patient has artery been treated with doxycycline as well as Zithromax here today. Patient will be advised not to have intercourse with any of her most recent partners until all been treated.  Olivia Mackie, MD 12/22/10 [phone]

## 2012-05-25 ENCOUNTER — Emergency Department (HOSPITAL_COMMUNITY): Payer: Self-pay

## 2012-05-25 ENCOUNTER — Emergency Department (HOSPITAL_COMMUNITY)
Admission: EM | Admit: 2012-05-25 | Discharge: 2012-05-25 | Disposition: A | Discharge status: Home / Self Care | Payer: Self-pay | Attending: Emergency Medicine | Admitting: Emergency Medicine

## 2012-05-25 ENCOUNTER — Encounter (HOSPITAL_COMMUNITY): Payer: Self-pay | Admitting: Family Medicine

## 2012-05-25 DIAGNOSIS — M25561 Pain in right knee: Secondary | ICD-10-CM

## 2012-05-25 DIAGNOSIS — M25569 Pain in unspecified knee: Secondary | ICD-10-CM | POA: Insufficient documentation

## 2012-05-25 DIAGNOSIS — F17200 Nicotine dependence, unspecified, uncomplicated: Secondary | ICD-10-CM | POA: Insufficient documentation

## 2012-05-25 NOTE — ED Notes (Signed)
Per pt sts 1 week of right knee pain relieved some from ibuprofen. sts her knee gives out on her. denies injury

## 2012-05-25 NOTE — ED Provider Notes (Signed)
History    This chart was scribed for non-physician practitioner working with Shelda Jakes, MD by Frederik Pear, ED Scribe. This patient was seen in room TR10C/TR10C and the patient's care was started at 1943.   CSN: [id number]  Arrival date & time 05/25/12  6578   First MD Initiated Contact with Patient 05/25/12 1943      Chief Complaint  Patient presents with  . Knee Pain    (Consider location/radiation/quality/duration/timing/severity/associated sxs/prior treatment) The history is provided by the patient and medical records. No language interpreter was used.   Carrie Cobb is a 32 y.o. female who presents to the Emergency Department with a chief complaint of sudden onset, constant right knee pain that began a week ago, but gradually worsened yesterday. She denies any injury or trauma to the area, but reports that her knee intermittently gives out on her. She denies any other symptoms. She reports that she took ibuprofen earlier today with some relief. She has no chronic medical conditions that she require daily medications. She has no allergies to medications.  History reviewed. No pertinent past medical history.  Past Surgical History  Procedure Laterality Date  . Hemorrhoid surgery      History reviewed. No pertinent family history.  History  Substance Use Topics  . Smoking status: Current Every Day Smoker -- 0.50 packs/day  . Smokeless tobacco: Not on file  . Alcohol Use: No    OB History   Grav Para Term Preterm Abortions TAB SAB Ect Mult Living                  Review of Systems A complete 10 system review of systems was obtained and all systems are negative except as noted in the HPI and PMH.  Allergies  Review of patient's allergies indicates no known allergies.  Home Medications   Current Outpatient Rx  Name  Route  Sig  Dispense  Refill  . ibuprofen (ADVIL,MOTRIN) 800 MG tablet   Oral   Take 800 mg by mouth every 8 (eight) hours as needed for  pain.           BP 119/81  Pulse 87  Temp(Src) 98.6 F (37 C) (Oral)  Resp 20  SpO2 98%  LMP 05/25/2012  Physical Exam  Nursing note and vitals reviewed. Constitutional: She is oriented to person, place, and time. She appears well-developed and well-nourished. No distress.  HENT:  Head: Normocephalic and atraumatic.  Eyes: EOM are normal. Pupils are equal, round, and reactive to light.  Neck: Normal range of motion. Neck supple. No tracheal deviation present.  Cardiovascular: Normal rate.   Pulmonary/Chest: Effort normal. No respiratory distress.  Abdominal: Soft. She exhibits no distension.  Musculoskeletal: Normal range of motion. She exhibits no edema.  Normal ROM and strength. Lachman, Posterior Drawer, Varus and Valgus tests negative. No effusion.  Neurological: She is alert and oriented to person, place, and time.  Skin: Skin is warm and dry.  Psychiatric: She has a normal mood and affect. Her behavior is normal.    ED Course  Procedures (including critical care time)  DIAGNOSTIC STUDIES: Oxygen Saturation is 98% on room air, normal by my interpretation.    COORDINATION OF CARE:  19:58- Discussed planned course of treatment with the patient, including antiinflammatories, knee sleeve, and following up with an orthopedist, who is agreeable at this time.  Labs Reviewed - No data to display Dg Knee Complete 4 Views Right  05/25/2012  *RADIOLOGY REPORT*  Clinical  Data: Knee pain for 2 days.  RIGHT KNEE - COMPLETE 4+ VIEW  Comparison: None.  Findings: Imaged bones, joints and soft tissues appear normal.  IMPRESSION: Normal study.   Original Report Authenticated By: Holley Dexter, M.D.     1. Knee pain, right       MDM   I personally performed the services described in this documentation, which was scribed in my presence. The recorded information has been reviewed and is accurate.          Roxy Horseman, PA-C 05/26/12 [phone]

## 2012-05-25 NOTE — Progress Notes (Signed)
Orthopedic Tech Progress Note Patient Details:  Carrie Cobb [birth year] [id number]  Ortho Devices Type of Ortho Device: Knee Sleeve Ortho Device/Splint Location: right leg Ortho Device/Splint Interventions: Application   Delenn Ahn 05/25/2012, 9:20 PM

## 2012-05-27 NOTE — ED Provider Notes (Signed)
Medical screening examination/treatment/procedure(s) were performed by non-physician practitioner and as supervising physician I was immediately available for consultation/collaboration.    Wilbern Pennypacker W. Trinton Prewitt, MD 05/27/12 1333 

## 2012-09-09 ENCOUNTER — Emergency Department (HOSPITAL_COMMUNITY)
Admission: EM | Admit: 2012-09-09 | Discharge: 2012-09-09 | Disposition: A | Discharge status: Home / Self Care | Payer: Self-pay | Attending: Emergency Medicine | Admitting: Emergency Medicine

## 2012-09-09 ENCOUNTER — Encounter (HOSPITAL_COMMUNITY): Payer: Self-pay | Admitting: Emergency Medicine

## 2012-09-09 DIAGNOSIS — S335XXA Sprain of ligaments of lumbar spine, initial encounter: Secondary | ICD-10-CM | POA: Insufficient documentation

## 2012-09-09 DIAGNOSIS — Y929 Unspecified place or not applicable: Secondary | ICD-10-CM | POA: Insufficient documentation

## 2012-09-09 DIAGNOSIS — Y939 Activity, unspecified: Secondary | ICD-10-CM | POA: Insufficient documentation

## 2012-09-09 DIAGNOSIS — F17200 Nicotine dependence, unspecified, uncomplicated: Secondary | ICD-10-CM | POA: Insufficient documentation

## 2012-09-09 DIAGNOSIS — X503XXA Overexertion from repetitive movements, initial encounter: Secondary | ICD-10-CM | POA: Insufficient documentation

## 2012-09-09 DIAGNOSIS — S39012A Strain of muscle, fascia and tendon of lower back, initial encounter: Secondary | ICD-10-CM

## 2012-09-09 MED ORDER — HYDROCODONE-ACETAMINOPHEN 5-325 MG PO TABS: 1.0000 | ORAL_TABLET | Freq: Four times a day (QID) | ORAL | Status: DC | PRN

## 2012-09-09 MED ORDER — IBUPROFEN 400 MG PO TABS
800.0000 mg | ORAL_TABLET | Freq: Once | ORAL | Status: AC
  Administered 2012-09-09: 800 mg via ORAL
  Filled 2012-09-09: qty 2

## 2012-09-09 MED ORDER — CYCLOBENZAPRINE HCL 10 MG PO TABS: 10.0000 mg | ORAL_TABLET | Freq: Two times a day (BID) | ORAL | Status: DC | PRN

## 2012-09-09 NOTE — ED Notes (Signed)
Pt c/o lower back pain after lifting boxes today at work

## 2012-09-09 NOTE — ED Provider Notes (Signed)
CSN: [id number]     Arrival date & time 09/09/12  1846 History    This chart was scribed for Dorthula Matas, non-physician practitioner working with Gilda Crease, * by Leone Payor, ED Scribe. This patient was seen in room TR06C/TR06C and the patient's care was started at 1846.   First MD Initiated Contact with Patient 09/09/12 [phone]     Chief Complaint  Patient presents with  . Back Pain    The history is provided by the patient. No language interpreter was used.    HPI Comments: Carrie Cobb is a 32 y.o. female who presents to the Emergency Department complaining of new, constant lower back pain that started a few hours ago today. Pt states she was lifting boxes at work today and has had pain since then. 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 She denies taking any OTC pain medication for the symptoms.   History reviewed. No pertinent past medical history. Past Surgical History  Procedure Laterality Date  . Hemorrhoid surgery     History reviewed. No pertinent family history. History  Substance Use Topics  . Smoking status: Current Every Day Smoker -- 0.50 packs/day  . Smokeless tobacco: Not on file  . Alcohol Use: No   OB History   Grav Para Term Preterm Abortions TAB SAB Ect Mult Living                 Review of Systems  Musculoskeletal: Positive for back pain. Negative for gait problem.  Neurological: Negative for weakness and numbness.  All other systems reviewed and are negative.    Allergies  Review of patient's allergies indicates no known allergies.  Home Medications   Current Outpatient Rx  Name  Route  Sig  Dispense  Refill  . acetaminophen (TYLENOL) 500 MG tablet   Oral   Take 1,000 mg by mouth every 6 (six) hours as needed for pain.         . naproxen (NAPROSYN) 500 MG tablet   Oral   Take 500 mg by mouth 2 (two) times daily as needed (pain).         . cyclobenzaprine (FLEXERIL) 10 MG tablet    Oral   Take 1 tablet (10 mg total) by mouth 2 (two) times daily as needed for muscle spasms.   20 tablet   0   . HYDROcodone-acetaminophen (NORCO/VICODIN) 5-325 MG per tablet   Oral   Take 1 tablet by mouth every 6 (six) hours as needed for pain.   15 tablet   0    BP 148/79  Pulse 86  Temp(Src) 98.4 F (36.9 C) (Oral)  Resp 16  SpO2 99% Physical Exam  Nursing note and vitals reviewed. Constitutional: She appears well-developed and well-nourished. No distress.  HENT:  Head: Normocephalic and atraumatic.  Eyes: Pupils are equal, round, and reactive to light.  Neck: Normal range of motion. Neck supple.  Cardiovascular: Normal rate and regular rhythm.   Pulmonary/Chest: Effort normal.  Abdominal: Soft.  Musculoskeletal:       Back:   Equal strength to bilateral lower extremities. Neurosensory function adequate to both legs. Skin color is normal. Skin is warm and moist. I see no step off deformity, no bony tenderness. Pt is able to ambulate without limp. Pain is relieved when sitting in certain positions. ROM is decreased due to pain. No crepitus, laceration, effusion, swelling.  Pulses are normal   Neurological: She is alert.  Skin: Skin is warm and dry.    ED Course   Procedures (including critical care time) DIAGNOSTIC STUDIES: Oxygen Saturation is 99% on RA, normal by my interpretation.    COORDINATION OF CARE: 6:56 PM Discussed treatment plan with pt at bedside and pt agreed to plan.   Labs Reviewed - No data to display No results found. 1. Back strain, initial encounter     MDM  Patient with back pain. No neurological deficits. Patient is ambulatory. No warning symptoms of back pain including: loss of bowel or bladder control, night sweats, waking from sleep with back pain, unexplained fevers or weight loss, h/o cancer, IVDU, recent significant  trauma. No concern for cauda equina, epidural abscess, or other serious cause of back pain. Conservative measures such  as rest, ice/heat and pain medicine indicated with PCP follow-up if no improvement with conservative management.   Rx: Vicodin and flexiril  I personally performed the services described in this documentation, which was scribed in my presence. The recorded information has been reviewed and is accurate.   Dorthula Matas, PA-C 09/09/12 1901

## 2012-09-10 NOTE — ED Provider Notes (Signed)
Medical screening examination/treatment/procedure(s) were performed by non-physician practitioner and as supervising physician I was immediately available for consultation/collaboration.   Gilda Crease, MD 09/10/12 [phone]

## 2012-12-03 ENCOUNTER — Encounter (HOSPITAL_COMMUNITY): Payer: Self-pay | Admitting: Emergency Medicine

## 2012-12-03 ENCOUNTER — Emergency Department (HOSPITAL_COMMUNITY): Payer: 59

## 2012-12-03 ENCOUNTER — Emergency Department (HOSPITAL_COMMUNITY)
Admission: EM | Admit: 2012-12-03 | Discharge: 2012-12-03 | Disposition: A | Discharge status: Home / Self Care | Payer: 59 | Attending: Emergency Medicine | Admitting: Emergency Medicine

## 2012-12-03 DIAGNOSIS — F17200 Nicotine dependence, unspecified, uncomplicated: Secondary | ICD-10-CM | POA: Insufficient documentation

## 2012-12-03 DIAGNOSIS — Z3202 Encounter for pregnancy test, result negative: Secondary | ICD-10-CM | POA: Insufficient documentation

## 2012-12-03 DIAGNOSIS — R079 Chest pain, unspecified: Secondary | ICD-10-CM | POA: Insufficient documentation

## 2012-12-03 LAB — D-DIMER, QUANTITATIVE: D-Dimer, Quant: <0.27 ug/mL-FEU (ref 0.00–0.48)

## 2012-12-03 LAB — POCT I-STAT TROPONIN I

## 2012-12-03 LAB — BASIC METABOLIC PANEL
CO2: 27 mEq/L (ref 19–32)
Calcium: 9.4 mg/dL (ref 8.4–10.5)
Chloride: 104 mEq/L (ref 96–112)
GFR calc non Af Amer: >90 mL/min (ref >90)
Glucose, Bld: 97 mg/dL (ref 70–99)
Potassium: 4.5 mEq/L (ref 3.5–5.1)
Sodium: 139 mEq/L (ref 135–145)

## 2012-12-03 LAB — CBC
Hemoglobin: 12.9 g/dL (ref 12.0–15.0)
MCH: 28.7 pg (ref 26.0–34.0)
RBC: 4.49 MIL/uL (ref 3.87–5.11)
WBC: 6.7 10*3/uL (ref 4.0–10.5)

## 2012-12-03 LAB — HEPATIC FUNCTION PANEL
ALT: 8 U/L (ref 0–35)
AST: 13 U/L (ref 0–37)
Albumin: 3.9 g/dL (ref 3.5–5.2)
Bilirubin, Direct: <0.1 mg/dL (ref 0.0–0.3)
Total Protein: 7.5 g/dL (ref 6.0–8.3)

## 2012-12-03 LAB — POCT PREGNANCY, URINE: Preg Test, Ur: NEGATIVE

## 2012-12-03 MED ORDER — OMEPRAZOLE 20 MG PO CPDR: 20.0000 mg | DELAYED_RELEASE_CAPSULE | Freq: Every day | ORAL | Status: DC

## 2012-12-03 MED ORDER — SUCRALFATE 1 G PO TABS: 1.0000 g | ORAL_TABLET | Freq: Four times a day (QID) | ORAL | Status: DC

## 2012-12-03 MED ORDER — GI COCKTAIL ~~LOC~~
30.0000 mL | Freq: Once | ORAL | Status: AC
  Administered 2012-12-03: 30 mL via ORAL
  Filled 2012-12-03: qty 30

## 2012-12-03 MED ORDER — KETOROLAC TROMETHAMINE 30 MG/ML IJ SOLN
30.0000 mg | Freq: Once | INTRAMUSCULAR | Status: DC
  Filled 2012-12-03: qty 1

## 2012-12-03 NOTE — ED Notes (Signed)
Pt reports relief of chest pain after GI cocktail. Reports continued back pain.

## 2012-12-03 NOTE — ED Notes (Signed)
MD at bedside. 

## 2012-12-03 NOTE — ED Notes (Addendum)
Presents with right upper back pain with radiation to right chest, pain began this am is constant, worse with movement, deep inspiration and cough described as squeezing, reports productive cough, clear. Denies nausea, denies dizziness, breath sounds clear, nothing makes pain better. Pain rated 8/10. +smoker.

## 2012-12-03 NOTE — ED Provider Notes (Signed)
CSN: [id number]     Arrival date & time 12/03/12  1503 History   First MD Initiated Contact with Patient 12/03/12 1559     Chief Complaint  Patient presents with  . Chest Pain   (Consider location/radiation/quality/duration/timing/severity/associated sxs/prior Treatment) HPI Patient presents with chest pain.  Pain began 2 days ago without clear precipitant. Prior to this pain, the patient has had cough for one week. Until 2 days ago, there was no associated chest pain. Since onset the pain has been persistent, sore, with presence in both the epigastric/sternal area, as well as the right posterior chest. Pain is worse with inspiration, not with exertion. There is no concurrent lower extremity edema, fever, chills, nausea, vomiting, diarrhea.  History reviewed. No pertinent past medical history. Past Surgical History  Procedure Laterality Date  . Hemorrhoid surgery     History reviewed. No pertinent family history. History  Substance Use Topics  . Smoking status: Current Every Day Smoker -- 0.50 packs/day  . Smokeless tobacco: Not on file  . Alcohol Use: No   OB History   Grav Para Term Preterm Abortions TAB SAB Ect Mult Living                 Review of Systems  Constitutional:       Per HPI, otherwise negative  HENT:       Per HPI, otherwise negative  Respiratory:       Per HPI, otherwise negative  Cardiovascular:       Per HPI, otherwise negative  Gastrointestinal: Negative for vomiting.  Endocrine:       Negative aside from HPI  Genitourinary:       Neg aside from HPI   Musculoskeletal:       Per HPI, otherwise negative  Skin: Negative.   Neurological: Negative for syncope.    Allergies  Review of patient's allergies indicates no known allergies.  Home Medications  No current outpatient prescriptions on file. BP 141/99  Pulse 80  Temp(Src) 98.1 F (36.7 C) (Oral)  Resp 16  Wt 142 lb 8 oz (64.638 kg)  BMI 21.35 kg/m2  SpO2 99%  LMP  11/27/2012 Physical Exam  Nursing note and vitals reviewed. Constitutional: She is oriented to person, place, and time. She appears well-developed and well-nourished. No distress.  HENT:  Head: Normocephalic and atraumatic.  Eyes: Conjunctivae and EOM are normal.  Cardiovascular: Normal rate and regular rhythm.   Pulmonary/Chest: Effort normal and breath sounds normal. No stridor. No respiratory distress. She has no wheezes.  Abdominal: She exhibits no distension. There is no tenderness. There is no rebound.  Musculoskeletal: She exhibits no edema.  Neurological: She is alert and oriented to person, place, and time. No cranial nerve deficit.  Skin: Skin is warm and dry.  Psychiatric: She has a normal mood and affect.    ED Course  Procedures (including critical care time) Labs Review Labs Reviewed  CBC  BASIC METABOLIC PANEL  D-DIMER, QUANTITATIVE  HEPATIC FUNCTION PANEL  LIPASE, BLOOD  POCT I-STAT TROPONIN I   Imaging Review Dg Chest 2 View  12/03/2012   CLINICAL DATA:  Chest and back pain.  EXAM: CHEST  2 VIEW  COMPARISON:  PA and lateral chest 04/30/2005.  FINDINGS: Heart size and mediastinal contours are within normal limits. Both lungs are clear. Visualized skeletal structures are unremarkable.  IMPRESSION: Negative exam.   Electronically Signed   By: Drusilla Kanner M.D.   On: 12/03/2012 15:48    EKG Interpretation  Ventricular Rate:  81 PR Interval:  126 QRS Duration: 90 QT Interval:  362 QTC Calculation: 420 R Axis:   68 Text Interpretation:  Normal sinus rhythm with sinus arrhythmia Normal ECG           Cardiac: 70sr, nml  O2- 99%ra, nml  7:01 PM Patient is in no distress, sitting upright.  We had lengthy discussion of all results, the low suspicion for ongoing coronary ischemia, and the high suspicion for a respiratory infection or GI etiology.  Patient will follow up with her primary care physician. She has no current complaints.  MDM  No  diagnosis found. This generally well-appearing female presents with new chest pain.  On exam she is awake alert, hemodynamically stable, afebrile, and in no distress.  Patient procedure, largely following GI cocktail, suggesting gastroenterologic etiology.  Patient has a smoking history, but d-dimer is negative, and there is low suspicion for PE. Patient was discharged in stable condition with a trial of medication and primary care followup.    Gerhard Munch, MD 12/03/12 1902

## 2012-12-03 NOTE — ED Notes (Addendum)
Pt reports 8/10 right back pain radiating forward to chest. Pain worse with movement of right arm and inspiration. Denies injury. Pt reports cough x 1 week with yellow sputum but denies SOB. NAD. Denies N/V/diahproesis/fever or chills.

## 2012-12-03 NOTE — ED Notes (Signed)
PT refused to have lab work drawn and refuses to be stuck again. This was reported to DR. Lockwood.

## 2013-02-19 ENCOUNTER — Emergency Department (HOSPITAL_COMMUNITY)
Admission: EM | Admit: 2013-02-19 | Discharge: 2013-02-19 | Disposition: A | Discharge status: Home / Self Care | Payer: 59 | Attending: Emergency Medicine | Admitting: Emergency Medicine

## 2013-02-19 ENCOUNTER — Encounter (HOSPITAL_COMMUNITY): Payer: Self-pay | Admitting: Emergency Medicine

## 2013-02-19 DIAGNOSIS — R52 Pain, unspecified: Secondary | ICD-10-CM | POA: Insufficient documentation

## 2013-02-19 DIAGNOSIS — F17200 Nicotine dependence, unspecified, uncomplicated: Secondary | ICD-10-CM | POA: Insufficient documentation

## 2013-02-19 DIAGNOSIS — J111 Influenza due to unidentified influenza virus with other respiratory manifestations: Secondary | ICD-10-CM | POA: Insufficient documentation

## 2013-02-19 NOTE — ED Notes (Signed)
sorethroat and aching all over x 2 days

## 2013-02-19 NOTE — Discharge Instructions (Signed)
Take medications as prescribed. Followup with your doctor in regards to your hospital visit. If you do not have a doctor use the resource guide listed below to help he find one. You may return to the emergency department if symptoms worsen, become progressive, or become more concerning. Read below to learn more about your diagnosis & reasons to return. Use Tylenol to treat your fever  ° °Influenza, Adult  °Influenza (flu)  starts suddenly, usually with a fever. It causes chills, dry and hacking cough, headache, body aches, and sore throat. Influenza spreads easily from one person to another.  °HOME CARE  °Only take medicines as told by your doctor.  °Rest.  °Drink enough fluids to keep your pee (urine) clear or pale yellow.  °Wash your hands often. Do this after you blow your nose, after you go to the bathroom, and before you touch food.  °GET HELP RIGHT AWAY IF:  °You have shortness of breath while resting.  °You have pain or pressure in the chest or belly (abdomen).  °You suddenly feel dizzy.  °You feel confused.  °You have a hard time breathing.  °Your skin or nails turn bluish in color.  °You get a bad neck pain or stiffness.  °You get a bad headache, face pain, or earache.  °You throw up (vomit) a lot and often.  °You have a fever > 101 that persists  °MAKE SURE YOU:  °Understand these instructions.  °Will watch your condition.  °Will get help right away if you are not doing well or get worse.  ° ° °RESOURCE GUIDE ° °Dental Problems ° °Patients with Medicaid: °[facility]                     [facility] °[address]                                           [address] °Phone:  [phone]                                                  Phone:  510-2600 ° °If unable to pay or uninsured, contact:  [facility] or [facility] to become qualified for the adult dental clinic. ° °Chronic Pain Problems °Contact [facility]  [phone] °Patients  need to be referred by their primary care doctor. ° °Insufficient Money for Medicine °Contact [employer]:  call "211" or [facility] 271-5999. ° °No Primary Care Doctor °Call [facility]  832-8000 °Other agencies that provide inexpensive medical care °   [facility]  [phone] °   [facility]  832-7272 °   [facility]  [phone] °   [facility]  832-4777 °   [facility]  [phone] °   [facility]  272-1050 ° °Psychological Services °[facility]  [phone] °[facility]  [phone] °[facility]   [phone] (emergency services 641-4993) ° °Substance Abuse Resources °[facility]  336-882-2125 °[facility] 336-784-9470 °[facility] 336-285-9073 °[facility] 336-845-3988 °Residential & Outpatient Substance Abuse Program  800-659-3381 ° °Abuse/Neglect °[place] [facility] (336) 641-3795 °[place]   Child Abuse Hotline 800-378-5315 (After Hours) ° °Emergency Shelter °[facility] (336) 271-5985 ° °Maternity Homes °[facility] (336) 275-9566 °[facility] (704) 372-4663 ° °MRSA Hotline #:   832-7006 ° ° ° °[place] [facility] ° °[facility]     [employer]                          [facility] °[address] [place]                       [address]      [address]  °[place]                                                [place]                            [place] °Phone:  349-3220                                   Phone:  [phone]                 Phone:  342-8140 ° °[facility] °Phone:  342-8316 ° °[place] [facility] Abuse Hotline °(336) 342-1394 °(336) 342-3537 (After Hours) ° ° °

## 2013-02-19 NOTE — ED Provider Notes (Signed)
CSN: [id number]     Arrival date & time 02/19/13  1343 History  This chart was scribed for non-physician practitioner working with Gilda Crease, MD  by Ashley Jacobs, ED scribe. This patient was seen in room TR05C/TR05C and the patient's care was started at 2:32 PM.   First MD Initiated Contact with Patient 02/19/13 1412     Chief Complaint  Patient presents with  . Sore Throat   (Consider location/radiation/quality/duration/timing/severity/associated sxs/prior Treatment) The history is provided by the patient and medical records. No language interpreter was used.   HPI Comments: Carrie Cobb is a 33 y.o. female who presents to the Emergency Department complaining of constant moderate sore throat for the past two days and remains unchanged. She has the associated symptoms of nasal congestion, generalized body aches, cough, decreased appetite, and sneezing. She has not taken anything for her symptoms. She had sick contact with her boyfriend who displayed similar symptoms. Denies flu shot this year.  Pt currently smoke 0.5 packs per day. She does not have any known allergies to medications. Pt does not have any pertinent medical complications.  History reviewed. No pertinent past medical history. Past Surgical History  Procedure Laterality Date  . Hemorrhoid surgery     No family history on file. History  Substance Use Topics  . Smoking status: Current Every Day Smoker -- 0.50 packs/day  . Smokeless tobacco: Not on file  . Alcohol Use: No   OB History   Grav Para Term Preterm Abortions TAB SAB Ect Mult Living                 Review of Systems  Constitutional: Positive for fever and chills.  HENT: Positive for congestion, rhinorrhea, sneezing and sore throat.   Respiratory: Positive for cough.   Musculoskeletal: Positive for myalgias.  All other systems reviewed and are negative.    Allergies  Review of patient's allergies indicates no known allergies.  Home  Medications   Current Outpatient Rx  Name  Route  Sig  Dispense  Refill  . acetaminophen (TYLENOL) 500 MG tablet   Oral   Take 1,000 mg by mouth.         . Pseudoeph-Doxylamine-DM-APAP (NYQUIL PO)   Oral   Take 2 capsules by mouth daily as needed.          BP 114/77  Pulse 117  Temp(Src) 98.3 F (36.8 C) (Oral)  Resp 18  Wt 150 lb 8 oz (68.266 kg)  SpO2 98% Physical Exam  Nursing note and vitals reviewed. Constitutional: She appears well-developed and well-nourished. No distress.  Awake, alert, nontoxic appearance  HENT:  Head: Normocephalic and atraumatic.  Right Ear: Tympanic membrane and ear canal normal.  Left Ear: Tympanic membrane and ear canal normal.  Nose: Mucosal edema (right nasal) present.  Mouth/Throat: Uvula is midline and oropharynx is clear and moist. No oropharyngeal exudate, posterior oropharyngeal edema or posterior oropharyngeal erythema.  Eyes: Conjunctivae are normal. No scleral icterus.  Neck: Normal range of motion. Neck supple.  Cardiovascular: Normal rate, regular rhythm and intact distal pulses.  Exam reveals friction rub. Exam reveals no gallop.   No murmur heard. Pulmonary/Chest: Effort normal and breath sounds normal. No respiratory distress. She has no wheezes.  Musculoskeletal: Normal range of motion. She exhibits no edema.  Lymphadenopathy:    She has cervical adenopathy (anterior).  Neurological: She is alert.  Speech is clear and goal oriented   Skin: Skin is warm and dry. She is not  diaphoretic.  Psychiatric: She has a normal mood and affect.    ED Course  Procedures (including critical care time) DIAGNOSTIC STUDIES: COORDINATION OF CARE:  2:31 PM Discussed course of care with pt . Pt understands and agrees.  Labs Review Labs Reviewed - No data to display Imaging Review No results found.  EKG Interpretation   None       MDM  No diagnosis found. Patient with symptoms consistent with influenza.  Vitals are stable.   No signs of dehydration, tolerating PO's.  Lungs are clear. Due to patient's presentation and physical exam a chest x-ray was not ordered bc likely diagnosis of flu.  Discussed the cost versus benefit of Tamiflu treatment with the patient.  The patient reports that she currently does not have insurance.  Therefore, patient not given a Rx for Tamiflu.   Patient will be discharged with instructions to orally hydrate, rest, and use over-the-counter medications such as anti-inflammatories ibuprofen and Aleve for muscle aches and Tylenol for fever.  Patient stable for discharge.  Return precautions given.  I personally performed the services described in this documentation, which was scribed in my presence. The recorded information has been reviewed and is accurate.     Santiago GladHeather Cecely Rengel, PA-C 02/19/13 1551

## 2013-02-24 NOTE — ED Provider Notes (Signed)
Medical screening examination/treatment/procedure(s) were performed by non-physician practitioner and as supervising physician I was immediately available for consultation/collaboration.  Concetta Guion J. Ranard Harte, MD 02/24/13 1104 

## 2013-12-24 ENCOUNTER — Encounter (HOSPITAL_COMMUNITY): Payer: Self-pay | Admitting: Family Medicine

## 2013-12-24 ENCOUNTER — Emergency Department (HOSPITAL_COMMUNITY)
Admission: EM | Admit: 2013-12-24 | Discharge: 2013-12-24 | Disposition: A | Discharge status: Home / Self Care | Payer: 59 | Attending: Emergency Medicine | Admitting: Emergency Medicine

## 2013-12-24 DIAGNOSIS — R200 Anesthesia of skin: Secondary | ICD-10-CM | POA: Insufficient documentation

## 2013-12-24 DIAGNOSIS — Z720 Tobacco use: Secondary | ICD-10-CM | POA: Insufficient documentation

## 2013-12-24 DIAGNOSIS — M545 Low back pain, unspecified: Secondary | ICD-10-CM

## 2013-12-24 DIAGNOSIS — G629 Polyneuropathy, unspecified: Secondary | ICD-10-CM | POA: Insufficient documentation

## 2013-12-24 MED ORDER — KETOROLAC TROMETHAMINE 60 MG/2ML IM SOLN
60.0000 mg | Freq: Once | INTRAMUSCULAR | Status: AC
  Administered 2013-12-24: 60 mg via INTRAMUSCULAR
  Filled 2013-12-24: qty 2

## 2013-12-24 MED ORDER — TRAMADOL HCL 50 MG PO TABS: 50.0000 mg | ORAL_TABLET | Freq: Four times a day (QID) | ORAL | Status: DC | PRN

## 2013-12-24 MED ORDER — CYCLOBENZAPRINE HCL 10 MG PO TABS: 10.0000 mg | ORAL_TABLET | Freq: Two times a day (BID) | ORAL | Status: DC | PRN

## 2013-12-24 MED ORDER — IBUPROFEN 600 MG PO TABS: 600.0000 mg | ORAL_TABLET | Freq: Four times a day (QID) | ORAL | Status: DC | PRN

## 2013-12-24 NOTE — Discharge Instructions (Signed)

## 2013-12-24 NOTE — ED Notes (Signed)
Pt comfortable with discharge and follow up instructions. Pt declines wheelchair, escorted to waiting area by this RN. Prescriptions x3. 

## 2013-12-24 NOTE — ED Provider Notes (Signed)
CSN: [id number]     Arrival date & time 12/24/13  1014 History  This chart is scribed for non-physician practitioner, Junius FinnerErin O'Malley, PA-C, working with Flint MelterElliott L Wentz, MD by Abel PrestoKara Demonbreun, ED Scribe.  This patient was seen in room TR11C/TR11C and the patient's care was started 11:15 AM.    Chief Complaint  Patient presents with  . Back Pain     The history is provided by the patient. No language interpreter was used.    HPI Comments: Carrie Cobb is a 33 y.o. female who presents to the Emergency Department complaining of throbbing lower back pain with onset two day ago that worsens at night. Pt notes pain radiating into her abdomen with intermittent numbness in legs and hands. Pt denies falling but does report recently moving and was lifting a lot of heavy boxes. Pt states that she has had back pain in the past but this pain is more intense. Denies neck pain or middle back pain.  Pt states her occupation includes standing on her feet for long periods of time. Pt has not taken medication for relief. Pt states her last menstrual period was 2 weeks ago, no chances of pregnancy. Pt states she has no history of IVDA, CA, drug allergies, back surgery, and kidney stones. Pt denies neck pain, upper back pain, leg pain, dysuria, hematuria, bowel/bladder incontinence and vaginal discharge or bleeding. Denies hx of kidney stones.   History reviewed. No pertinent past medical history. Past Surgical History  Procedure Laterality Date  . Hemorrhoid surgery     No family history on file. History  Substance Use Topics  . Smoking status: Current Every Day Smoker -- 0.50 packs/day  . Smokeless tobacco: Not on file  . Alcohol Use: No   OB History    No data available     Review of Systems  Constitutional: Negative for fever.  Gastrointestinal: Positive for abdominal pain (back pain radiates into abdomen). Negative for nausea, vomiting and diarrhea.  Genitourinary: Negative for dysuria, urgency,  hematuria, flank pain, vaginal bleeding, vaginal discharge, menstrual problem and pelvic pain.  Musculoskeletal: Positive for myalgias and back pain. Negative for gait problem and neck pain.  Neurological: Positive for numbness.      Allergies  Review of patient's allergies indicates no known allergies.  Home Medications   Prior to Admission medications   Medication Sig Start Date End Date Taking? Authorizing Provider  acetaminophen (TYLENOL) 500 MG tablet Take 1,000 mg by mouth.    Historical Provider, MD  cyclobenzaprine (FLEXERIL) 10 MG tablet Take 1 tablet (10 mg total) by mouth 2 (two) times daily as needed for muscle spasms. 12/24/13   Junius FinnerErin O'Malley, PA-C  ibuprofen (ADVIL,MOTRIN) 600 MG tablet Take 1 tablet (600 mg total) by mouth every 6 (six) hours as needed. 12/24/13   Junius FinnerErin O'Malley, PA-C  Pseudoeph-Doxylamine-DM-APAP (NYQUIL PO) Take 2 capsules by mouth daily as needed.    Historical Provider, MD  traMADol (ULTRAM) 50 MG tablet Take 1 tablet (50 mg total) by mouth every 6 (six) hours as needed. 12/24/13   Junius FinnerErin O'Malley, PA-C   BP 106/54 mmHg  Pulse 69  Temp(Src) 99.1 F (37.3 C) (Oral)  Resp 16  SpO2 100%  LMP 12/10/2013 Physical Exam  Constitutional: She is oriented to person, place, and time. She appears well-developed and well-nourished.  HENT:  Head: Normocephalic and atraumatic.  Eyes: EOM are normal.  Neck: Normal range of motion.  Cardiovascular: Normal rate and intact distal pulses.   Distal  pulses 2+  Pulmonary/Chest: Effort normal.  Abdominal: Soft. There is no tenderness.  Musculoskeletal: Normal range of motion.  Tenderness along lumbar musculature bilaterally. No midline spinal tenderness Full ROM all extremities Increased pain with full extension of back while standing.  Neurological: She is alert and oriented to person, place, and time.  Sensation intact in all four extremities 5/5 strength in upper and lower extremities bilaterally Antalgic gait   Skin: Skin is warm and dry.  Psychiatric: She has a normal mood and affect. Her behavior is normal.  Nursing note and vitals reviewed.   ED Course  Procedures (including critical care time) DIAGNOSTIC STUDIES: Oxygen Saturation is 100% on room air, normal by my interpretation.    COORDINATION OF CARE: 11:20 AM Discussed treatment plan with patient at beside, the patient agrees with the plan and has no further questions at this time.   Labs Review Labs Reviewed - No data to display  Imaging Review No results found.   EKG Interpretation None      MDM   Final diagnoses:  Bilateral low back pain without sciatica  Neuropathy   Pt c/o back pain after moving recently and lifting heavy boxes. Denies falls or trauma. Pt c/o intermittent hand and foot numbness, however, no focal neuro deficit on exam. No cervical or thoracic spinal or muscular tenderness. Tenderness in lumbar musculature.  No midline spinal tenderness.  Pt c/o pain radiating into abdomen, however, abdominal exam- soft, non-tender.  Pt denies fever, n/v/d, urinary or vaginal symptoms. Do not believe imaging or further work up needed at this time. Not concerned for emergent process taking place including but not limited to cauda equina, spinal abscess, pyelonephritis, ectopic pregnancy. Will tx symptomatically as needed for pain. Rx: flexeril, tramadol, and ibuprofen. Home care instructions provided. Return precautions provided. Pt verbalized understanding and agreement with tx plan.    I personally performed the services described in this documentation, which was scribed in my presence. The recorded information has been reviewed and is accurate.   Junius Finnerrin O'Malley, PA-C 12/24/13 1558  Flint MelterElliott L Wentz, MD 12/24/13 [phone]

## 2013-12-24 NOTE — Progress Notes (Signed)
   12/24/13 1112  Clinical Encounter Type  Visited With Patient  Visit Type Other (Comment) (Advanced Directive)  Stress Factors  Patient Stress Factors Other (Comment) (In pain)  Advance Directives (For Healthcare)  Does patient have an advance directive? No  Would patient like information on creating an advanced directive? Yes - Transport plannerducational materials given   Chaplain responded to page that pt wants to do advanced directive.Chaplain provided form and went through it with pt. Chaplain also checked in with pt, who seemed tired, and pt said, "I'm in pain." Will follow as needed.  Carrie Cobb, Carrie Cobb, Chaplain 12/24/2013 11:15 AM

## 2013-12-24 NOTE — ED Notes (Signed)
Pt presents from home with c/o low back pain with associated bilateral hand tingling x2 days. Pt reports recently moving and was lifting a lot of heavy boxes.

## 2014-03-11 ENCOUNTER — Emergency Department (HOSPITAL_COMMUNITY)
Admission: EM | Admit: 2014-03-11 | Discharge: 2014-03-11 | Disposition: A | Discharge status: Home / Self Care | Payer: No Typology Code available for payment source | Attending: Emergency Medicine | Admitting: Emergency Medicine

## 2014-03-11 ENCOUNTER — Encounter (HOSPITAL_COMMUNITY): Payer: Self-pay

## 2014-03-11 ENCOUNTER — Emergency Department (HOSPITAL_COMMUNITY): Payer: No Typology Code available for payment source

## 2014-03-11 DIAGNOSIS — Y998 Other external cause status: Secondary | ICD-10-CM | POA: Insufficient documentation

## 2014-03-11 DIAGNOSIS — S29001A Unspecified injury of muscle and tendon of front wall of thorax, initial encounter: Secondary | ICD-10-CM | POA: Insufficient documentation

## 2014-03-11 DIAGNOSIS — Y92410 Unspecified street and highway as the place of occurrence of the external cause: Secondary | ICD-10-CM | POA: Insufficient documentation

## 2014-03-11 DIAGNOSIS — Z87891 Personal history of nicotine dependence: Secondary | ICD-10-CM | POA: Insufficient documentation

## 2014-03-11 DIAGNOSIS — S199XXA Unspecified injury of neck, initial encounter: Secondary | ICD-10-CM | POA: Diagnosis present

## 2014-03-11 DIAGNOSIS — Y9389 Activity, other specified: Secondary | ICD-10-CM | POA: Diagnosis not present

## 2014-03-11 MED ORDER — CYCLOBENZAPRINE HCL 10 MG PO TABS: 10.0000 mg | ORAL_TABLET | Freq: Two times a day (BID) | ORAL | Status: DC | PRN

## 2014-03-11 MED ORDER — IBUPROFEN 600 MG PO TABS: 600.0000 mg | ORAL_TABLET | Freq: Four times a day (QID) | ORAL | Status: DC | PRN

## 2014-03-11 MED ORDER — IBUPROFEN 800 MG PO TABS
800.0000 mg | ORAL_TABLET | Freq: Once | ORAL | Status: AC
  Administered 2014-03-11: 800 mg via ORAL
  Filled 2014-03-11: qty 1
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
## 2014-03-11 NOTE — Discharge Instructions (Signed)
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
## 2014-04-20 ENCOUNTER — Encounter (HOSPITAL_COMMUNITY): Payer: Self-pay | Admitting: Emergency Medicine

## 2014-04-20 ENCOUNTER — Emergency Department (HOSPITAL_COMMUNITY)
Admission: EM | Admit: 2014-04-20 | Discharge: 2014-04-20 | Disposition: A | Discharge status: Home / Self Care | Payer: Self-pay | Attending: Emergency Medicine | Admitting: Emergency Medicine

## 2014-04-20 DIAGNOSIS — R6883 Chills (without fever): Secondary | ICD-10-CM | POA: Insufficient documentation

## 2014-04-20 DIAGNOSIS — N939 Abnormal uterine and vaginal bleeding, unspecified: Secondary | ICD-10-CM | POA: Insufficient documentation

## 2014-04-20 DIAGNOSIS — R531 Weakness: Secondary | ICD-10-CM | POA: Insufficient documentation

## 2014-04-20 DIAGNOSIS — Z87891 Personal history of nicotine dependence: Secondary | ICD-10-CM | POA: Insufficient documentation

## 2014-04-20 DIAGNOSIS — M791 Myalgia: Secondary | ICD-10-CM | POA: Insufficient documentation

## 2014-04-20 DIAGNOSIS — R05 Cough: Secondary | ICD-10-CM | POA: Insufficient documentation

## 2014-04-20 DIAGNOSIS — Z3202 Encounter for pregnancy test, result negative: Secondary | ICD-10-CM | POA: Insufficient documentation

## 2014-04-20 DIAGNOSIS — R112 Nausea with vomiting, unspecified: Secondary | ICD-10-CM | POA: Insufficient documentation

## 2014-04-20 LAB — CBC WITH DIFFERENTIAL/PLATELET
Basophils Absolute: 0 10*3/uL (ref 0.0–0.1)
Basophils Relative: 0 % (ref 0–1)
EOS PCT: 0 % (ref 0–5)
Eosinophils Absolute: 0 10*3/uL (ref 0.0–0.7)
HCT: 36.2 % (ref 36.0–46.0)
Hemoglobin: 12.5 g/dL (ref 12.0–15.0)
LYMPHS ABS: 0.5 10*3/uL — AB (ref 0.7–4.0)
LYMPHS PCT: 10 % — AB (ref 12–46)
MCH: 29.3 pg (ref 26.0–34.0)
MCHC: 34.5 g/dL (ref 30.0–36.0)
MCV: 85 fL (ref 78.0–100.0)
Monocytes Absolute: 0.5 10*3/uL (ref 0.1–1.0)
Monocytes Relative: 9 % (ref 3–12)
Neutro Abs: 4.1 10*3/uL (ref 1.7–7.7)
Neutrophils Relative %: 81 % — ABNORMAL HIGH (ref 43–77)
PLATELETS: 206 10*3/uL (ref 150–400)
RBC: 4.26 MIL/uL (ref 3.87–5.11)
RDW: 13.4 % (ref 11.5–15.5)
WBC: 5.1 10*3/uL (ref 4.0–10.5)

## 2014-04-20 LAB — URINALYSIS, ROUTINE W REFLEX MICROSCOPIC
GLUCOSE, UA: NEGATIVE mg/dL
KETONES UR: 40 mg/dL — AB
Leukocytes, UA: NEGATIVE
Nitrite: NEGATIVE
PROTEIN: NEGATIVE mg/dL
Specific Gravity, Urine: 1.021 (ref 1.005–1.030)
UROBILINOGEN UA: 1 mg/dL (ref 0.0–1.0)
pH: 6 (ref 5.0–8.0)

## 2014-04-20 LAB — COMPREHENSIVE METABOLIC PANEL
ALT: 10 U/L (ref 0–35)
ANION GAP: 5 (ref 5–15)
AST: 16 U/L (ref 0–37)
Albumin: 3.4 g/dL — ABNORMAL LOW (ref 3.5–5.2)
Alkaline Phosphatase: 41 U/L (ref 39–117)
CALCIUM: 8.5 mg/dL (ref 8.4–10.5)
CO2: 23 mmol/L (ref 19–32)
Chloride: 108 mmol/L (ref 96–112)
Creatinine, Ser: 0.81 mg/dL (ref 0.50–1.10)
Glucose, Bld: 88 mg/dL (ref 70–99)
POTASSIUM: 3.8 mmol/L (ref 3.5–5.1)
Sodium: 136 mmol/L (ref 135–145)
TOTAL PROTEIN: 6.3 g/dL (ref 6.0–8.3)
Total Bilirubin: 0.4 mg/dL (ref 0.3–1.2)

## 2014-04-20 LAB — URINE MICROSCOPIC-ADD ON

## 2014-04-20 LAB — PREGNANCY, URINE: Preg Test, Ur: NEGATIVE

## 2014-04-20 LAB — LIPASE, BLOOD: Lipase: 16 U/L (ref 11–59)

## 2014-04-20 MED ORDER — SODIUM CHLORIDE 0.9 % IV BOLUS (SEPSIS)
1000.0000 mL | Freq: Once | INTRAVENOUS | Status: AC
  Administered 2014-04-20: 1000 mL via INTRAVENOUS

## 2014-04-20 MED ORDER — PROMETHAZINE HCL 25 MG PO TABS: 25.0000 mg | ORAL_TABLET | Freq: Four times a day (QID) | ORAL | Status: DC | PRN

## 2014-04-20 MED ORDER — MORPHINE SULFATE 4 MG/ML IJ SOLN: 4.0000 mg | Freq: Once | INTRAMUSCULAR | Status: DC

## 2014-04-20 MED ORDER — DIPHENHYDRAMINE HCL 50 MG/ML IJ SOLN
12.5000 mg | Freq: Once | INTRAMUSCULAR | Status: AC
  Administered 2014-04-20: 12.5 mg via INTRAVENOUS
  Filled 2014-04-20: qty 1

## 2014-04-20 MED ORDER — METOCLOPRAMIDE HCL 5 MG/ML IJ SOLN
10.0000 mg | Freq: Once | INTRAMUSCULAR | Status: AC
  Administered 2014-04-20: 10 mg via INTRAVENOUS
  Filled 2014-04-20: qty 2

## 2014-04-20 MED ORDER — KETOROLAC TROMETHAMINE 30 MG/ML IJ SOLN
30.0000 mg | Freq: Once | INTRAMUSCULAR | Status: AC
  Administered 2014-04-20: 30 mg via INTRAVENOUS
  Filled 2014-04-20: qty 1

## 2014-04-20 MED ORDER — ONDANSETRON HCL 4 MG/2ML IJ SOLN
4.0000 mg | Freq: Once | INTRAMUSCULAR | Status: AC
  Administered 2014-04-20: 4 mg via INTRAVENOUS
  Filled 2014-04-20: qty 2

## 2014-04-20 MED ORDER — TRAMADOL HCL 50 MG PO TABS: 50.0000 mg | ORAL_TABLET | Freq: Four times a day (QID) | ORAL | Status: DC | PRN

## 2014-04-20 NOTE — ED Notes (Signed)
Pt placed in gown and in bed. PT monitored by pulse ox and bp cuff. 

## 2014-04-20 NOTE — ED Notes (Signed)
Pt urine sent to lab.

## 2014-04-20 NOTE — ED Notes (Signed)
Pt c/o nausea, abd pain, chills since Sunday. Pt sts she has not had any food in 2 days. Pt denies vomiting/diarrhea.

## 2014-04-20 NOTE — ED Notes (Signed)
Pt ambulated to the bathroom.  

## 2014-04-20 NOTE — Discharge Instructions (Signed)
Rest. Drink plenty of fluids. Ibuprofen or tylenol for body aches and fever. Tramadol for severe pain. Phenergan for nausea. Follow up with your doctor if not improving. Return if worsening.    Nausea and Vomiting Nausea is a sick feeling that often comes before throwing up (vomiting). Vomiting is a reflex where stomach contents come out of your mouth. Vomiting can cause severe loss of body fluids (dehydration). Children and elderly adults can become dehydrated quickly, especially if they also have diarrhea. Nausea and vomiting are symptoms of a condition or disease. It is important to find the cause of your symptoms. CAUSES   Direct irritation of the stomach lining. This irritation can result from increased acid production (gastroesophageal reflux disease), infection, food poisoning, taking certain medicines (such as nonsteroidal anti-inflammatory drugs), alcohol use, or tobacco use.  Signals from the brain.These signals could be caused by a headache, heat exposure, an inner ear disturbance, increased pressure in the brain from injury, infection, a tumor, or a concussion, pain, emotional stimulus, or metabolic problems.  An obstruction in the gastrointestinal tract (bowel obstruction).  Illnesses such as diabetes, hepatitis, gallbladder problems, appendicitis, kidney problems, cancer, sepsis, atypical symptoms of a heart attack, or eating disorders.  Medical treatments such as chemotherapy and radiation.  Receiving medicine that makes you sleep (general anesthetic) during surgery. DIAGNOSIS Your caregiver may ask for tests to be done if the problems do not improve after a few days. Tests may also be done if symptoms are severe or if the reason for the nausea and vomiting is not clear. Tests may include:  Urine tests.  Blood tests.  Stool tests.  Cultures (to look for evidence of infection).  X-rays or other imaging studies. Test results can help your caregiver make decisions about  treatment or the need for additional tests. TREATMENT You need to stay well hydrated. Drink frequently but in small amounts.You may wish to drink water, sports drinks, clear broth, or eat frozen ice pops or gelatin dessert to help stay hydrated.When you eat, eating slowly may help prevent nausea.There are also some antinausea medicines that may help prevent nausea. HOME CARE INSTRUCTIONS   Take all medicine as directed by your caregiver.  If you do not have an appetite, do not force yourself to eat. However, you must continue to drink fluids.  If you have an appetite, eat a normal diet unless your caregiver tells you differently.  Eat a variety of complex carbohydrates (rice, wheat, potatoes, bread), lean meats, yogurt, fruits, and vegetables.  Avoid high-fat foods because they are more difficult to digest.  Drink enough water and fluids to keep your urine clear or pale yellow.  If you are dehydrated, ask your caregiver for specific rehydration instructions. Signs of dehydration may include:  Severe thirst.  Dry lips and mouth.  Dizziness.  Dark urine.  Decreasing urine frequency and amount.  Confusion.  Rapid breathing or pulse. SEEK IMMEDIATE MEDICAL CARE IF:   You have blood or brown flecks (like coffee grounds) in your vomit.  You have black or bloody stools.  You have a severe headache or stiff neck.  You are confused.  You have severe abdominal pain.  You have chest pain or trouble breathing.  You do not urinate at least once every 8 hours.  You develop cold or clammy skin.  You continue to vomit for longer than 24 to 48 hours.  You have a fever. MAKE SURE YOU:   Understand these instructions.  Will watch your condition.  Will get help right away if you are not doing well or get worse. Document Released: 01/29/2005 Document Revised: 04/23/2011 Document Reviewed: 06/28/2010 [facility] Patient Information 2015 [place], [place]. This information is not  intended to replace advice given to you by your health care provider. Make sure you discuss any questions you have with your health care provider.

## 2014-04-20 NOTE — ED Provider Notes (Signed)
CSN: [id number]     Arrival date & time 04/20/14  [id number] History   First MD Initiated Contact with Patient 04/20/14 1013     Chief Complaint  Patient presents with  . Abdominal Pain     (Consider location/radiation/quality/duration/timing/severity/associated sxs/prior Treatment) HPI Carrie Cobb is a 34 y.o. female with no medical problems, presents to ED with complaint of nausea, vomiting, body aches, abdominal pain. Pt states symptoms started 2 days ago. States nausea and vomiting persistent. States hasnt been able to eat or drink anything for 2 days. Denies diarrhea. States pain in abdomen is "all over." States "My whole body hurts." Did not take temp at home but states she is having chills. Denies nasal congestion or sore throat. States she is having a mild cough but states "its from when I used to smoke." Pt took ibuprofen yesterday, no medications taken today. Denies anyone around her with similar symptoms. Nothing makes her symptoms better or worse.  No other complaints.   History reviewed. No pertinent past medical history. Past Surgical History  Procedure Laterality Date  . Hemorrhoid surgery     History reviewed. No pertinent family history. History  Substance Use Topics  . Smoking status: Former Smoker -- 0.50 packs/day  . Smokeless tobacco: Not on file  . Alcohol Use: No   OB History    No data available     Review of Systems  Constitutional: Positive for chills and fatigue.  HENT: Negative for congestion and sore throat.   Respiratory: Positive for cough. Negative for chest tightness and shortness of breath.   Cardiovascular: Negative for chest pain, palpitations and leg swelling.  Gastrointestinal: Positive for nausea, vomiting and abdominal pain. Negative for diarrhea.  Genitourinary: Positive for vaginal bleeding. Negative for dysuria, flank pain, vaginal discharge, vaginal pain and pelvic pain.  Musculoskeletal: Positive for myalgias and arthralgias. Negative for  neck pain and neck stiffness.  Skin: Negative for rash.  Neurological: Positive for weakness. Negative for dizziness and headaches.  All other systems reviewed and are negative.     Allergies  Review of patient's allergies indicates no known allergies.  Home Medications   Prior to Admission medications   Medication Sig Start Date End Date Taking? Authorizing Provider  acetaminophen (TYLENOL) 500 MG tablet Take 1,000 mg by mouth.    Historical Provider, MD  cyclobenzaprine (FLEXERIL) 10 MG tablet Take 1 tablet (10 mg total) by mouth 2 (two) times daily as needed for muscle spasms. Patient not taking: Reported on 04/20/2014 03/11/14   Fayrene HelperBowie Tran, PA-C  ibuprofen (ADVIL,MOTRIN) 600 MG tablet Take 1 tablet (600 mg total) by mouth every 6 (six) hours as needed. Patient not taking: Reported on 04/20/2014 03/11/14   Fayrene HelperBowie Tran, PA-C  Pseudoeph-Doxylamine-DM-APAP (NYQUIL PO) Take 2 capsules by mouth daily as needed.    Historical Provider, MD  traMADol (ULTRAM) 50 MG tablet Take 1 tablet (50 mg total) by mouth every 6 (six) hours as needed. Patient not taking: Reported on 04/20/2014 12/24/13   Junius FinnerErin O'Malley, PA-C   BP 130/81 mmHg  Pulse 95  Temp(Src) 99.4 F (37.4 C) (Oral)  Resp 16  Ht 5\' 8"  (1.727 m)  Wt 150 lb (68.04 kg)  BMI 22.81 kg/m2  SpO2 98%  LMP 04/20/2014 Physical Exam  Constitutional: She is oriented to person, place, and time. She appears well-developed and well-nourished. No distress.  HENT:  Head: Normocephalic and atraumatic.  Right Ear: External ear normal.  Left Ear: External ear normal.  Nose: Nose normal.  Mouth/Throat: Oropharynx is clear and moist.  Eyes: Conjunctivae and EOM are normal. Pupils are equal, round, and reactive to light.  Neck: Normal range of motion. Neck supple.  Cardiovascular: Normal rate, regular rhythm and normal heart sounds.   Pulmonary/Chest: Effort normal and breath sounds normal. No respiratory distress. She has no wheezes. She has no rales.   Abdominal: Soft. Bowel sounds are normal. She exhibits no distension. There is tenderness. There is no rebound and no guarding.  Diffuse tenderness  Musculoskeletal: She exhibits no edema.  Neurological: She is alert and oriented to person, place, and time. No cranial nerve deficit.  Skin: Skin is warm and dry.  Psychiatric: She has a normal mood and affect. Her behavior is normal.  Nursing note and vitals reviewed.   ED Course  Procedures (including critical care time) Labs Review Labs Reviewed  CBC WITH DIFFERENTIAL/PLATELET - Abnormal; Notable for the following:    Neutrophils Relative % 81 (*)    Lymphocytes Relative 10 (*)    Lymphs Abs 0.5 (*)    All other components within normal limits  COMPREHENSIVE METABOLIC PANEL - Abnormal; Notable for the following:    BUN <5 (*)    Albumin 3.4 (*)    All other components within normal limits  URINALYSIS, ROUTINE W REFLEX MICROSCOPIC - Abnormal; Notable for the following:    Hgb urine dipstick MODERATE (*)    Bilirubin Urine SMALL (*)    Ketones, ur 40 (*)    All other components within normal limits  URINE MICROSCOPIC-ADD ON - Abnormal; Notable for the following:    Squamous Epithelial / LPF FEW (*)    All other components within normal limits  LIPASE, BLOOD  PREGNANCY, URINE  POC URINE PREG, ED    Imaging Review No results found.   EKG Interpretation None      MDM   Final diagnoses:  Non-intractable vomiting with nausea, vomiting of unspecified type    Patient with nausea, vomiting, diffuse abdominal pain. Temp 99.4, patient complaining of chills. Abdomen is benign but diffusely tender. Will check urinalysis, blood work, IV fluids, Toradol and Zofran ordered. Will reassess. Blood pressure and heart rate normal otherwise. Patient is otherwise healthy, nontoxic-appearing today.  2:45 PM Delay in obtaining labs. Lab was contacted several times. Results still pending. PT feeling better.   2:57 PM Labs unremarkable.  Pt states "I feel much better, just have headache." Pt's abdominal pain, body aches, nausea resolved. Will try PO challenge. UA still pending.   3:40 PM UA unremarkable other than hematuria, which is most likely vaginal contaminant. She is currently on her menstrual cycle. Urine preg is negative. PT feeling better. Tolerating PO fluids. Abdomen non tender at this time and there is no abdominal pain. i do not think pt has a surgical abdomen or any major infectious process. She denies vaginal dc or possibility of sti. Home with tramadol, phenergan, follow up with pcp. Return precautions discussed.   Filed Vitals:   04/20/14 1514 04/20/14 1515 04/20/14 1545 04/20/14 1600  BP: 120/67 124/72 108/43 115/54  Pulse: 65 72 58 67  Temp:      TempSrc:      Resp: 18     Height:      Weight:      SpO2: 100% 100% 98% 98%     Jaynie Crumble, PA-C 04/20/14 1629  Doug Sou, MD 04/20/14 1659

## 2014-11-26 ENCOUNTER — Encounter (HOSPITAL_COMMUNITY): Payer: Self-pay | Admitting: Emergency Medicine

## 2014-11-26 ENCOUNTER — Emergency Department (HOSPITAL_COMMUNITY): Payer: Self-pay

## 2014-11-26 ENCOUNTER — Emergency Department (HOSPITAL_COMMUNITY)
Admission: EM | Admit: 2014-11-26 | Discharge: 2014-11-26 | Disposition: A | Discharge status: Home / Self Care | Payer: Self-pay | Attending: Emergency Medicine | Admitting: Emergency Medicine

## 2014-11-26 DIAGNOSIS — Y9289 Other specified places as the place of occurrence of the external cause: Secondary | ICD-10-CM | POA: Insufficient documentation

## 2014-11-26 DIAGNOSIS — S299XXA Unspecified injury of thorax, initial encounter: Secondary | ICD-10-CM | POA: Insufficient documentation

## 2014-11-26 DIAGNOSIS — Y9389 Activity, other specified: Secondary | ICD-10-CM | POA: Insufficient documentation

## 2014-11-26 DIAGNOSIS — Y998 Other external cause status: Secondary | ICD-10-CM | POA: Insufficient documentation

## 2014-11-26 DIAGNOSIS — Z87891 Personal history of nicotine dependence: Secondary | ICD-10-CM | POA: Insufficient documentation

## 2014-11-26 DIAGNOSIS — S060X0A Concussion without loss of consciousness, initial encounter: Secondary | ICD-10-CM | POA: Insufficient documentation

## 2014-11-26 DIAGNOSIS — M940 Chondrocostal junction syndrome [Tietze]: Secondary | ICD-10-CM | POA: Insufficient documentation

## 2014-11-26 MED ORDER — IBUPROFEN 600 MG PO TABS: 600.0000 mg | ORAL_TABLET | Freq: Four times a day (QID) | ORAL | Status: DC | PRN

## 2014-11-26 MED ORDER — IBUPROFEN 800 MG PO TABS
800.0000 mg | ORAL_TABLET | Freq: Once | ORAL | Status: AC
  Administered 2014-11-26: 800 mg via ORAL
  Filled 2014-11-26: qty 1

## 2014-11-26 MED ORDER — CYCLOBENZAPRINE HCL 10 MG PO TABS: 10.0000 mg | ORAL_TABLET | Freq: Two times a day (BID) | ORAL | Status: DC | PRN

## 2014-11-26 NOTE — ED Notes (Signed)
Pt presents via EMS and [facility] for right rib pain and nausea.   Last VS 118/72, 82hr, 16resp

## 2014-11-26 NOTE — Discharge Instructions (Signed)
Concussion, Adult A concussion, or closed-head injury, is a brain injury caused by a direct blow to the head or by a quick and sudden movement (jolt) of the head or neck. Concussions are usually not life-threatening. Even so, the effects of a concussion can be serious. If you have had a concussion before, you are more likely to experience concussion-like symptoms after a direct blow to the head.  CAUSES  Direct blow to the head, such as from running into another player during a soccer game, being hit in a fight, or hitting your head on a hard surface.  A jolt of the head or neck that causes the brain to move back and forth inside the skull, such as in a car crash. SIGNS AND SYMPTOMS The signs of a concussion can be hard to notice. Early on, they may be missed by you, family members, and health care providers. You may look fine but act or feel differently. Symptoms are usually temporary, but they may last for days, weeks, or even longer. Some symptoms may appear right away while others may not show up for hours or days. Every head injury is different. Symptoms include:  Mild to moderate headaches that will not go away.  A feeling of pressure inside your head.  Having more trouble than usual:  Learning or remembering things you have heard.  Answering questions.  Paying attention or concentrating.  Organizing daily tasks.  Making decisions and solving problems.  Slowness in thinking, acting or reacting, speaking, or reading.  Getting lost or being easily confused.  Feeling tired all the time or lacking energy (fatigued).  Feeling drowsy.  Sleep disturbances.  Sleeping more than usual.  Sleeping less than usual.  Trouble falling asleep.  Trouble sleeping (insomnia).  Loss of balance or feeling lightheaded or dizzy.  Nausea or vomiting.  Numbness or tingling.  Increased sensitivity to:  Sounds.  Lights.  Distractions.  Vision problems or eyes that tire  easily.  Diminished sense of taste or smell.  Ringing in the ears.  Mood changes such as feeling sad or anxious.  Becoming easily irritated or angry for little or no reason.  Lack of motivation.  Seeing or hearing things other people do not see or hear (hallucinations). DIAGNOSIS Your health care provider can usually diagnose a concussion based on a description of your injury and symptoms. He or she will ask whether you passed out (lost consciousness) and whether you are having trouble remembering events that happened right before and during your injury. Your evaluation might include:  A brain scan to look for signs of injury to the brain. Even if the test shows no injury, you may still have a concussion.  Blood tests to be sure other problems are not present. TREATMENT  Concussions are usually treated in an emergency department, in urgent care, or at a clinic. You may need to stay in the hospital overnight for further treatment.  Tell your health care provider if you are taking any medicines, including prescription medicines, over-the-counter medicines, and natural remedies. Some medicines, such as blood thinners (anticoagulants) and aspirin, may increase the chance of complications. Also tell your health care provider whether you have had alcohol or are taking illegal drugs. This information may affect treatment.  Your health care provider will send you home with important instructions to follow.  How fast you will recover from a concussion depends on many factors. These factors include how severe your concussion is, what part of your brain was injured,  your age, and how healthy you were before the concussion. °· Most people with mild injuries recover fully. Recovery can take time. In general, recovery is slower in older persons. Also, persons who have had a concussion in the past or have other medical problems may find that it takes longer to recover from their current injury. °HOME  CARE INSTRUCTIONS °General Instructions °· Carefully follow the directions your health care provider gave you. °· Only take over-the-counter or prescription medicines for pain, discomfort, or fever as directed by your health care provider. °· Take only those medicines that your health care provider has approved. °· Do not drink alcohol until your health care provider says you are well enough to do so. Alcohol and certain other drugs may slow your recovery and can put you at risk of further injury. °· If it is harder than usual to remember things, write them down. °· If you are easily distracted, try to do one thing at a time. For example, do not try to watch TV while fixing dinner. °· Talk with family members or close friends when making important decisions. °· Keep all follow-up appointments. Repeated evaluation of your symptoms is recommended for your recovery. °· Watch your symptoms and tell others to do the same. Complications sometimes occur after a concussion. Older adults with a brain injury may have a higher risk of serious complications, such as a blood clot on the brain. °· Tell your teachers, school nurse, school counselor, coach, athletic trainer, or work manager about your injury, symptoms, and restrictions. Tell them about what you can or cannot do. They should watch for: °¨ Increased problems with attention or concentration. °¨ Increased difficulty remembering or learning new information. °¨ Increased time needed to complete tasks or assignments. °¨ Increased irritability or decreased ability to cope with stress. °¨ Increased symptoms. °· Rest. Rest helps the brain to heal. Make sure you: °¨ Get plenty of sleep at night. Avoid staying up late at night. °¨ Keep the same bedtime hours on weekends and weekdays. °¨ Rest during the day. Take daytime naps or rest breaks when you feel tired. °· Limit activities that require a lot of thought or concentration. These include: °¨ Doing homework or job-related  work. °¨ Watching TV. °¨ Working on the computer. °· Avoid any situation where there is potential for another head injury (football, hockey, soccer, basketball, martial arts, downhill snow sports and horseback riding). Your condition will get worse every time you experience a concussion. You should avoid these activities until you are evaluated by the appropriate follow-up health care providers. °Returning To Your Regular Activities °You will need to return to your normal activities slowly, not all at once. You must give your body and brain enough time for recovery. °· Do not return to sports or other athletic activities until your health care provider tells you it is safe to do so. °· Ask your health care provider when you can drive, ride a bicycle, or operate heavy machinery. Your ability to react may be slower after a brain injury. Never do these activities if you are dizzy. °· Ask your health care provider about when you can return to work or school. °Preventing Another Concussion °It is very important to avoid another brain injury, especially before you have recovered. In rare cases, another injury can lead to permanent brain damage, brain swelling, or death. The risk of this is greatest during the first 7-10 days after a head injury. Avoid injuries by: °· Wearing a   seat belt when riding in a car.  Drinking alcohol only in moderation.  Wearing a helmet when biking, skiing, skateboarding, skating, or doing similar activities.  Avoiding activities that could lead to a second concussion, such as contact or recreational sports, until your health care provider says it is okay.  Taking safety measures in your home.  Remove clutter and tripping hazards from floors and stairways.  Use grab bars in bathrooms and handrails by stairs.  Place non-slip mats on floors and in bathtubs.  Improve lighting in dim areas. SEEK MEDICAL CARE IF:  You have increased problems paying attention or  concentrating.  You have increased difficulty remembering or learning new information.  You need more time to complete tasks or assignments than before.  You have increased irritability or decreased ability to cope with stress.  You have more symptoms than before. Seek medical care if you have any of the following symptoms for more than 2 weeks after your injury:  Lasting (chronic) headaches.  Dizziness or balance problems.  Nausea.  Vision problems.  Increased sensitivity to noise or light.  Depression or mood swings.  Anxiety or irritability.  Memory problems.  Difficulty concentrating or paying attention.  Sleep problems.  Feeling tired all the time. SEEK IMMEDIATE MEDICAL CARE IF:  You have severe or worsening headaches. These may be a sign of a blood clot in the brain.  You have weakness (even if only in one hand, leg, or part of the face).  You have numbness.  You have decreased coordination.  You vomit repeatedly.  You have increased sleepiness.  One pupil is larger than the other.  You have convulsions.  You have slurred speech.  You have increased confusion. This may be a sign of a blood clot in the brain.  You have increased restlessness, agitation, or irritability.  You are unable to recognize people or places.  You have neck pain.  It is difficult to wake you up.  You have unusual behavior changes.  You lose consciousness. MAKE SURE YOU:  Understand these instructions.  Will watch your condition.  Will get help right away if you are not doing well or get worse.   This information is not intended to replace advice given to you by your health care provider. Make sure you discuss any questions you have with your health care provider.   Document Released: 04/21/2003 Document Revised: 02/19/2014 Document Reviewed: 08/21/2012 [employer] Interactive Patient Education 2016 [employer]  Chest Wall Pain Chest wall pain is pain in or  around the bones and muscles of your chest. Sometimes, an injury causes this pain. Sometimes, the cause may not be known. This pain may take several weeks or longer to get better. HOME CARE INSTRUCTIONS  Pay attention to any changes in your symptoms. Take these actions to help with your pain:   Rest as told by your health care provider.   Avoid activities that cause pain. These include any activities that use your chest muscles or your abdominal and side muscles to lift heavy items.   If directed, apply ice to the painful area:  Put ice in a plastic bag.  Place a towel between your skin and the bag.  Leave the ice on for 20 minutes, 2-3 times per day.  Take over-the-counter and prescription medicines only as told by your health care provider.  Do not use tobacco products, including cigarettes, chewing tobacco, and e-cigarettes. If you need help quitting, ask your health care provider.  Keep  all follow-up visits as told by your health care provider. This is important. SEEK MEDICAL CARE IF:  You have a fever.  Your chest pain becomes worse.  You have new symptoms. SEEK IMMEDIATE MEDICAL CARE IF:  You have nausea or vomiting.  You feel sweaty or light-headed.  You have a cough with phlegm (sputum) or you cough up blood.  You develop shortness of breath.   This information is not intended to replace advice given to you by your health care provider. Make sure you discuss any questions you have with your health care provider.   Document Released: 01/29/2005 Document Revised: 10/20/2014 Document Reviewed: 04/26/2014 [employer] Interactive Patient Education [employer]
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
## 2014-11-26 NOTE — ED Notes (Signed)
Pt c/o right sided rib pain, rates 9/10, painful with deep breaths and painful to palpation, also c/o nausea

## 2015-05-11 DIAGNOSIS — R51 Headache: Secondary | ICD-10-CM | POA: Insufficient documentation

## 2015-05-12 ENCOUNTER — Encounter (HOSPITAL_COMMUNITY): Payer: Self-pay | Admitting: Emergency Medicine

## 2015-05-12 ENCOUNTER — Emergency Department (HOSPITAL_COMMUNITY)
Admission: EM | Admit: 2015-05-12 | Discharge: 2015-05-12 | Disposition: A | Discharge status: Home / Self Care | Payer: No Typology Code available for payment source | Attending: Emergency Medicine | Admitting: Emergency Medicine

## 2015-05-12 NOTE — ED Notes (Signed)
Pt. reports headache for 4 days , denies head injury , no fever or chills , denies nausea or blurred vision .

## 2015-08-28 ENCOUNTER — Emergency Department (HOSPITAL_COMMUNITY): Payer: No Typology Code available for payment source

## 2015-08-28 ENCOUNTER — Emergency Department (HOSPITAL_COMMUNITY)
Admission: EM | Admit: 2015-08-28 | Discharge: 2015-08-28 | Disposition: A | Discharge status: Home / Self Care | Payer: No Typology Code available for payment source | Attending: Emergency Medicine | Admitting: Emergency Medicine

## 2015-08-28 ENCOUNTER — Encounter (HOSPITAL_COMMUNITY): Payer: Self-pay | Admitting: *Deleted

## 2015-08-28 DIAGNOSIS — Z87891 Personal history of nicotine dependence: Secondary | ICD-10-CM | POA: Insufficient documentation

## 2015-08-28 DIAGNOSIS — Z79899 Other long term (current) drug therapy: Secondary | ICD-10-CM | POA: Insufficient documentation

## 2015-08-28 DIAGNOSIS — G5602 Carpal tunnel syndrome, left upper limb: Secondary | ICD-10-CM

## 2015-08-28 MED ORDER — NAPROXEN 500 MG PO TABS: 500.0000 mg | ORAL_TABLET | Freq: Two times a day (BID) | ORAL | Status: DC

## 2015-08-28 NOTE — ED Provider Notes (Signed)
History  By signing my name below, I, Carrie Cobb, attest that this documentation has been prepared under the direction and in the presence of Arthor Captain, PA-C. Electronically Signed: Earmon Cobb, ED Scribe. 08/28/2015. 3:55 PM.  Chief Complaint  Patient presents with  . Wrist Pain   The history is provided by the patient and medical records. No language interpreter was used.    HPI Comments:  Carrie Cobb is a 35 y.o. female who presents to the Emergency Department complaining of left wrist pain that began one week ago. She states she was asleep when the pain began, causing her to awaken and states the pain is usually worse at night. She reports associated worsening numbness and tingling of the left hand. She states she works with her hands frequently at her job. All movements of the left wrist increases the pains. Resting the area helps alleviate the pain. She has been taking Tylenol and Motrin with minimal relief of the pain. She denies bruising, wounds, fever or chills. She is right-hand dominant.  History reviewed. No pertinent past medical history. Past Surgical History  Procedure Laterality Date  . Hemorrhoid surgery     History reviewed. No pertinent family history. Social History  Substance Use Topics  . Smoking status: Former Smoker -- 0.50 packs/day  . Smokeless tobacco: None  . Alcohol Use: No   OB History    No data available     Review of Systems  Constitutional: Negative for fever and chills.  Musculoskeletal: Positive for arthralgias.  Skin: Negative for color change and wound.  Neurological: Positive for numbness.    Allergies  Review of patient's allergies indicates no known allergies.  Home Medications   Prior to Admission medications   Medication Sig Start Date End Date Taking? Authorizing Provider  cyclobenzaprine (FLEXERIL) 10 MG tablet Take 1 tablet (10 mg total) by mouth 2 (two) times daily as needed for muscle spasms. 11/26/14    Fayrene Helper, PA-C  ibuprofen (ADVIL,MOTRIN) 600 MG tablet Take 1 tablet (600 mg total) by mouth every 6 (six) hours as needed for moderate pain. 11/26/14   Fayrene Helper, PA-C  promethazine (PHENERGAN) 25 MG tablet Take 1 tablet (25 mg total) by mouth every 6 (six) hours as needed for nausea or vomiting. Patient not taking: Reported on 11/26/2014 04/20/14   Tatyana Kirichenko, PA-C  traMADol (ULTRAM) 50 MG tablet Take 1 tablet (50 mg total) by mouth every 6 (six) hours as needed. Patient not taking: Reported on 11/26/2014 04/20/14   Jaynie Crumble, PA-C   Triage Vitals: BP 123/75 mmHg  Pulse 96  Temp(Src) 98.4 F (36.9 C) (Oral)  Resp 18  SpO2 100%  LMP 08/28/2015 Physical Exam  Constitutional: She is oriented to person, place, and time. She appears well-developed and well-nourished.  HENT:  Head: Normocephalic and atraumatic.  Eyes: EOM are normal.  Neck: Normal range of motion.  Cardiovascular: Normal rate.   Left radial pulse 2+  Pulmonary/Chest: Effort normal.  Musculoskeletal: She exhibits tenderness.  Limited ROM of left wrist due to pain. Full ROM of all digits of left hand. Tenderness to palpation of distal aspect of left forearm.  Neurological: She is alert and oriented to person, place, and time.  Skin: Skin is warm and dry.  Psychiatric: She has a normal mood and affect. Her behavior is normal.  Nursing note and vitals reviewed.   ED Course  Procedures (including critical care time) DIAGNOSTIC STUDIES: Oxygen Saturation is 100% on RA, normal by my  interpretation.   COORDINATION OF CARE: 3:53 PM- Will order left wrist splint and prescribe NSAID. Pt verbalizes understanding and agrees to plan.  Medications - No data to display  Labs Review Labs Reviewed - No data to display  Imaging Review Dg Wrist Complete Left  08/28/2015  CLINICAL DATA:  Pain in left wrist for about a week.  No injury. EXAM: LEFT WRIST - COMPLETE 3+ VIEW COMPARISON:  None. FINDINGS: There is no  evidence of fracture or dislocation. There is no evidence of arthropathy or other focal bone abnormality. Soft tissues are unremarkable. IMPRESSION: Negative. Electronically Signed   By: Kennith CenterEric  Mansell M.D.   On: 08/28/2015 15:15   I have personally reviewed and evaluated these images and lab results as part of my medical decision-making.   EKG Interpretation None      MDM   Final diagnoses:  Carpal tunnel syndrome of left wrist    Patient presents with likely carpal tunnel syndrome.  Pt advised to follow up with orthopedics. Patient given brace while in ED, conservative therapy recommended and discussed. Patient will be discharged home & is agreeable with above plan. Returns precautions discussed. Pt appears safe for discharge.   I personally performed the services described in this documentation, which was scribed in my presence. The recorded information has been reviewed and is accurate.       Arthor CaptainAbigail Farrah Skoda, PA-C 08/29/15 1621  Derwood KaplanAnkit Nanavati, MD 09/01/15 1630

## 2015-08-28 NOTE — ED Notes (Signed)
Pt reports left wrist pain x 1 week. Pt is unsure of any injury but reports it could be from work, heavy lifting or fighting. No acute distress noted at triage.

## 2015-08-28 NOTE — Discharge Instructions (Signed)

## 2016-01-30 ENCOUNTER — Emergency Department (HOSPITAL_COMMUNITY)
Admission: EM | Admit: 2016-01-30 | Discharge: 2016-01-30 | Disposition: A | Discharge status: Home / Self Care | Payer: Self-pay | Attending: Emergency Medicine | Admitting: Emergency Medicine

## 2016-01-30 ENCOUNTER — Encounter (HOSPITAL_COMMUNITY): Payer: Self-pay

## 2016-01-30 DIAGNOSIS — Z87891 Personal history of nicotine dependence: Secondary | ICD-10-CM | POA: Insufficient documentation

## 2016-01-30 DIAGNOSIS — R1084 Generalized abdominal pain: Secondary | ICD-10-CM | POA: Insufficient documentation

## 2016-01-30 DIAGNOSIS — R110 Nausea: Secondary | ICD-10-CM | POA: Insufficient documentation

## 2016-01-30 DIAGNOSIS — Z79899 Other long term (current) drug therapy: Secondary | ICD-10-CM | POA: Insufficient documentation

## 2016-01-30 DIAGNOSIS — Z5321 Procedure and treatment not carried out due to patient leaving prior to being seen by health care provider: Secondary | ICD-10-CM | POA: Insufficient documentation

## 2016-01-30 HISTORY — DX: Gastro-esophageal reflux disease without esophagitis: K21.9

## 2016-01-30 LAB — COMPREHENSIVE METABOLIC PANEL
ALBUMIN: 3.8 g/dL (ref 3.5–5.0)
ALT: 12 U/L — ABNORMAL LOW (ref 14–54)
ANION GAP: 7 (ref 5–15)
AST: 16 U/L (ref 15–41)
Alkaline Phosphatase: 39 U/L (ref 38–126)
BUN: 10 mg/dL (ref 6–20)
CHLORIDE: 106 mmol/L (ref 101–111)
CO2: 25 mmol/L (ref 22–32)
Calcium: 9.2 mg/dL (ref 8.9–10.3)
Creatinine, Ser: 0.79 mg/dL (ref 0.44–1.00)
GFR calc Af Amer: >60 mL/min (ref >60)
GFR calc non Af Amer: >60 mL/min (ref >60)
GLUCOSE: 98 mg/dL (ref 65–99)
POTASSIUM: 4.5 mmol/L (ref 3.5–5.1)
SODIUM: 138 mmol/L (ref 135–145)
Total Bilirubin: 0.5 mg/dL (ref 0.3–1.2)
Total Protein: 6.9 g/dL (ref 6.5–8.1)

## 2016-01-30 LAB — URINALYSIS, ROUTINE W REFLEX MICROSCOPIC
BILIRUBIN URINE: NEGATIVE
Glucose, UA: NEGATIVE mg/dL
HGB URINE DIPSTICK: NEGATIVE
Ketones, ur: NEGATIVE mg/dL
Leukocytes, UA: NEGATIVE
Nitrite: NEGATIVE
PH: 7 (ref 5.0–8.0)
Protein, ur: NEGATIVE mg/dL
SPECIFIC GRAVITY, URINE: 1.024 (ref 1.005–1.030)

## 2016-01-30 LAB — CBC
HEMATOCRIT: 36 % (ref 36.0–46.0)
HEMOGLOBIN: 11.5 g/dL — AB (ref 12.0–15.0)
MCH: 27.1 pg (ref 26.0–34.0)
MCHC: 31.9 g/dL (ref 30.0–36.0)
MCV: 84.9 fL (ref 78.0–100.0)
Platelets: 244 10*3/uL (ref 150–400)
RBC: 4.24 MIL/uL (ref 3.87–5.11)
RDW: 16 % — ABNORMAL HIGH (ref 11.5–15.5)
WBC: 7.2 10*3/uL (ref 4.0–10.5)

## 2016-01-30 LAB — I-STAT BETA HCG BLOOD, ED (MC, WL, AP ONLY): I-stat hCG, quantitative: <5 m[IU]/mL (ref <5)

## 2016-01-30 LAB — LIPASE, BLOOD: Lipase: 23 U/L (ref 11–51)

## 2016-01-30 NOTE — ED Triage Notes (Signed)
Patient complains of 5 days of generalized abdominal cramping and burning. Concerned that she now has abdominal bloating and nausea for same. Has had normal bowel movements but has developed diarrhea with any solid intake. Alert and oriented, NAD

## 2016-01-30 NOTE — ED Notes (Signed)
Unable to locate pt for a room  

## 2016-01-30 NOTE — ED Notes (Signed)
Patient didn't answer when called for vitals.. 

## 2016-01-31 ENCOUNTER — Emergency Department (HOSPITAL_COMMUNITY)
Admission: EM | Admit: 2016-01-31 | Discharge: 2016-01-31 | Disposition: A | Discharge status: Home / Self Care | Payer: Self-pay | Attending: Emergency Medicine | Admitting: Emergency Medicine

## 2016-01-31 ENCOUNTER — Encounter (HOSPITAL_COMMUNITY): Payer: Self-pay | Admitting: *Deleted

## 2016-01-31 DIAGNOSIS — R1013 Epigastric pain: Secondary | ICD-10-CM | POA: Insufficient documentation

## 2016-01-31 DIAGNOSIS — Z87891 Personal history of nicotine dependence: Secondary | ICD-10-CM | POA: Insufficient documentation

## 2016-01-31 MED ORDER — RANITIDINE HCL 150 MG PO CAPS: 150.0000 mg | ORAL_CAPSULE | Freq: Every day | ORAL | 0 refills | Status: DC

## 2016-01-31 MED ORDER — GI COCKTAIL ~~LOC~~
30.0000 mL | Freq: Once | ORAL | Status: AC
  Administered 2016-01-31: 30 mL via ORAL
  Filled 2016-01-31: qty 30

## 2016-01-31 NOTE — ED Provider Notes (Signed)
[facility]-EMERGENCY DEPT Provider Note   CSN: [id number] Arrival date & time: 01/31/16  [id number]     History   Chief Complaint Chief Complaint  Patient presents with  . Abdominal Pain    HPI Carrie Cobb is a 35 y.o. female.  The history is provided by the patient.  Abdominal Pain   This is a new problem. Episode onset: 1 week. The problem occurs constantly (fluctuating). The problem has not changed since onset.The pain is located in the generalized abdominal region (migratory). The quality of the pain is cramping and dull. The pain is moderate. Associated symptoms include diarrhea (only after eating; loose.), nausea and vomiting (several days ago; once. after seeing someone who vomited.). Pertinent negatives include anorexia, fever, hematochezia, melena, constipation and dysuria. The symptoms are aggravated by eating (approx 2-3 min post prandial). The symptoms are relieved by certain positions.   Reports marijuana use.  Was here yesterday, but LWBS. Hcg, CBC, CMP, and Lipase were all reassuring.   Past Medical History:  Diagnosis Date  . GERD (gastroesophageal reflux disease)     Patient Active Problem List   Diagnosis Date Noted  . 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 DEPRESSION 06/17/2008  . 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 BACK STRAIN, LUMBAR 12/23/2006  . GERD 09/23/2006    Past Surgical History:  Procedure Laterality Date  . HEMORRHOID SURGERY      OB History    No data available       Home Medications    Prior to Admission medications   Medication Sig Start Date End Date Taking? Authorizing Provider  cyclobenzaprine (FLEXERIL) 10 MG tablet Take 1 tablet (10 mg total) by mouth 2 (two) times daily as needed for muscle spasms. Patient not taking: Reported on 01/31/2016 11/26/14   Fayrene HelperBowie Tran, PA-C  naproxen (NAPROSYN) 500 MG tablet Take 1 tablet (500 mg total) by mouth 2 (two) times daily with a meal. Patient not taking: Reported on 01/31/2016 08/28/15   Arthor CaptainAbigail Harris, PA-C    promethazine (PHENERGAN) 25 MG tablet Take 1 tablet (25 mg total) by mouth every 6 (six) hours as needed for nausea or vomiting. Patient not taking: Reported on 01/31/2016 04/20/14   Tatyana Kirichenko, PA-C  ranitidine (ZANTAC) 150 MG capsule Take 1 capsule (150 mg total) by mouth daily. 01/31/16   Nira ConnPedro Eduardo Cardama, MD  traMADol (ULTRAM) 50 MG tablet Take 1 tablet (50 mg total) by mouth every 6 (six) hours as needed. Patient not taking: Reported on 01/31/2016 04/20/14   Jaynie Crumbleatyana Kirichenko, PA-C    Family History History reviewed. No pertinent family history.  Social History Social History  Substance Use Topics  . Smoking status: Former Smoker    Packs/day: 0.50  . Smokeless tobacco: Never Used  . Alcohol use No     Allergies   Patient has no known allergies.   Review of Systems Review of Systems  Constitutional: Negative for fever.  Gastrointestinal: Positive for abdominal pain, diarrhea (only after eating; loose.), nausea and vomiting (several days ago; once. after seeing someone who vomited.). Negative for anorexia, constipation, hematochezia and melena.  Genitourinary: Negative for dysuria.  Ten systems are reviewed and are negative for acute change except as noted in the HPI  Physical Exam Updated Vital Signs BP 113/64   Pulse 66   SpO2 100%   Physical Exam  Constitutional: She is oriented to person, place, and time. She appears well-developed and well-nourished. No distress.  HENT:  Head: Normocephalic and atraumatic.  Nose: Nose normal.  Eyes:  Conjunctivae and EOM are normal. Pupils are equal, round, and reactive to light. Right eye exhibits no discharge. Left eye exhibits no discharge. No scleral icterus.  Neck: Normal range of motion. Neck supple.  Cardiovascular: Normal rate and regular rhythm.  Exam reveals no gallop and no friction rub.   No murmur heard. Pulmonary/Chest: Effort normal and breath sounds normal. No stridor. No respiratory distress. She has  no rales.  Abdominal: Soft. She exhibits no distension. There is tenderness in the left upper quadrant. There is no rigidity, no rebound, no guarding and no CVA tenderness.  Musculoskeletal: She exhibits no edema or tenderness.  Neurological: She is alert and oriented to person, place, and time.  Skin: Skin is warm and dry. No rash noted. She is not diaphoretic. No erythema.  Psychiatric: She has a normal mood and affect.  Vitals reviewed.    ED Treatments / Results  Labs (all labs ordered are listed, but only abnormal results are displayed) Labs Reviewed - No data to display  EKG  EKG Interpretation None       Radiology No results found.  Procedures Procedures (including critical care time)  Medications Ordered in ED Medications  gi cocktail (Maalox,Lidocaine,Donnatal) (30 mLs Oral Given 01/31/16 1004)     Initial Impression / Assessment and Plan / ED Course  I have reviewed the triage vital signs and the nursing notes.  Pertinent labs & imaging results that were available during my care of the patient were reviewed by me and considered in my medical decision making (see chart for details).  Clinical Course     Labs from yesterday were grossly reassuring. The patient not pregnant thus doubt ectopic pregnancy. Pain is consistent with ovarian torsion. Abdomen soft with epigastric and left upper quadrant tenderness to palpation. No evidence of pancreatitis yesterday. Low suspicion for small bowel obstruction, pancreatitis, acute cholecystitis, colitis, diverticulitis.  Presentation most consistent with exacerbation of patient's known gastritis. Given GI cocktail which had significant improvement in patient's symptoms.  She is safe for discharge with strict return precautions. Instructed to start back on her antacid regimen.  Final Clinical Impressions(s) / ED Diagnoses   Final diagnoses:  Epigastric pain   Disposition: Discharge  Condition: Good  I have  discussed the results, Dx and Tx plan with the patient who expressed understanding and agree(s) with the plan. Discharge instructions discussed at great length. The patient was given strict return precautions who verbalized understanding of the instructions. No further questions at time of discharge.    New Prescriptions   RANITIDINE (ZANTAC) 150 MG CAPSULE    Take 1 capsule (150 mg total) by mouth daily.    Follow Up: Primary care provider  Schedule an appointment as soon as possible for a visit  If symptoms do not improve or  worsen for Gastroenterology referral      Nira ConnPedro Eduardo Cardama, MD 01/31/16 1124

## 2016-01-31 NOTE — ED Triage Notes (Signed)
Pt arrives with c/o abdominal distension and n/v/d x6 days.

## 2016-01-31 NOTE — ED Notes (Signed)
Got patient into a gown and on the monitor waiting for provider 
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
## 2018-02-22 ENCOUNTER — Emergency Department (HOSPITAL_COMMUNITY)
Admission: EM | Admit: 2018-02-22 | Discharge: 2018-02-22 | Disposition: A | Discharge status: Home / Self Care | Payer: Self-pay | Attending: Emergency Medicine | Admitting: Emergency Medicine

## 2018-02-22 ENCOUNTER — Encounter (HOSPITAL_COMMUNITY): Payer: Self-pay

## 2018-02-22 DIAGNOSIS — M545 Low back pain: Secondary | ICD-10-CM | POA: Insufficient documentation

## 2018-02-22 DIAGNOSIS — M7918 Myalgia, other site: Secondary | ICD-10-CM

## 2018-02-22 MED ORDER — CYCLOBENZAPRINE HCL 10 MG PO TABS: 10.0000 mg | ORAL_TABLET | Freq: Two times a day (BID) | ORAL | 0 refills | Status: DC | PRN

## 2018-02-22 MED ORDER — NAPROXEN 500 MG PO TABS: 500.0000 mg | ORAL_TABLET | Freq: Two times a day (BID) | ORAL | 0 refills | Status: DC

## 2018-02-22 NOTE — ED Notes (Signed)
Pt stable, ambulatory, states understanding of discharge instructions 
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
## 2018-02-22 NOTE — Discharge Instructions (Addendum)
Apply warm compresses to sore muscles for 20 minutes at a time. Take Flexeril and naproxen as needed as prescribed.  Do not take Flexeril driving or operating machinery. Follow-up with your doctor if pain persists.

## 2018-03-18 ENCOUNTER — Other Ambulatory Visit: Payer: Self-pay

## 2018-03-18 ENCOUNTER — Emergency Department (HOSPITAL_COMMUNITY)
Admission: EM | Admit: 2018-03-18 | Discharge: 2018-03-18 | Disposition: A | Discharge status: Home / Self Care | Payer: Medicaid Other | Attending: Emergency Medicine | Admitting: Emergency Medicine

## 2018-03-18 ENCOUNTER — Encounter (HOSPITAL_COMMUNITY): Payer: Self-pay

## 2018-03-18 DIAGNOSIS — Z79899 Other long term (current) drug therapy: Secondary | ICD-10-CM | POA: Insufficient documentation

## 2018-03-18 DIAGNOSIS — R112 Nausea with vomiting, unspecified: Secondary | ICD-10-CM | POA: Insufficient documentation

## 2018-03-18 DIAGNOSIS — R197 Diarrhea, unspecified: Secondary | ICD-10-CM

## 2018-03-18 DIAGNOSIS — Z87891 Personal history of nicotine dependence: Secondary | ICD-10-CM | POA: Insufficient documentation

## 2018-03-18 DIAGNOSIS — R1084 Generalized abdominal pain: Secondary | ICD-10-CM | POA: Insufficient documentation

## 2018-03-18 LAB — COMPREHENSIVE METABOLIC PANEL
ALT: 12 U/L (ref 0–44)
AST: 16 U/L (ref 15–41)
Albumin: 3.7 g/dL (ref 3.5–5.0)
Alkaline Phosphatase: 37 U/L — ABNORMAL LOW (ref 38–126)
Anion gap: 8 (ref 5–15)
BUN: 7 mg/dL (ref 6–20)
CO2: 23 mmol/L (ref 22–32)
Calcium: 9.1 mg/dL (ref 8.9–10.3)
Chloride: 109 mmol/L (ref 98–111)
Creatinine, Ser: 0.8 mg/dL (ref 0.44–1.00)
GFR calc Af Amer: >60 mL/min (ref >60)
GFR calc non Af Amer: >60 mL/min (ref >60)
Glucose, Bld: 112 mg/dL — ABNORMAL HIGH (ref 70–99)
Potassium: 3.7 mmol/L (ref 3.5–5.1)
Sodium: 140 mmol/L (ref 135–145)
Total Bilirubin: 0.4 mg/dL (ref 0.3–1.2)
Total Protein: 6.8 g/dL (ref 6.5–8.1)

## 2018-03-18 LAB — CBC WITH DIFFERENTIAL/PLATELET
Abs Immature Granulocytes: 0.02 10*3/uL (ref 0.00–0.07)
Basophils Absolute: 0 10*3/uL (ref 0.0–0.1)
Basophils Relative: 0 %
Eosinophils Absolute: 0 10*3/uL (ref 0.0–0.5)
Eosinophils Relative: 0 %
HCT: 32.9 % — ABNORMAL LOW (ref 36.0–46.0)
Hemoglobin: 10.2 g/dL — ABNORMAL LOW (ref 12.0–15.0)
Immature Granulocytes: 0 %
Lymphocytes Relative: 15 %
Lymphs Abs: 1.2 10*3/uL (ref 0.7–4.0)
MCH: 25.4 pg — ABNORMAL LOW (ref 26.0–34.0)
MCHC: 31 g/dL (ref 30.0–36.0)
MCV: 81.8 fL (ref 80.0–100.0)
Monocytes Absolute: 0.6 10*3/uL (ref 0.1–1.0)
Monocytes Relative: 8 %
Neutro Abs: 6.1 10*3/uL (ref 1.7–7.7)
Neutrophils Relative %: 77 %
Platelets: 292 10*3/uL (ref 150–400)
RBC: 4.02 MIL/uL (ref 3.87–5.11)
RDW: 15.6 % — ABNORMAL HIGH (ref 11.5–15.5)
WBC: 8 10*3/uL (ref 4.0–10.5)
nRBC: 0 % (ref 0.0–0.2)

## 2018-03-18 LAB — URINALYSIS, ROUTINE W REFLEX MICROSCOPIC
Bilirubin Urine: NEGATIVE
Glucose, UA: NEGATIVE mg/dL
Hgb urine dipstick: NEGATIVE
Ketones, ur: NEGATIVE mg/dL
Leukocytes, UA: NEGATIVE
Nitrite: NEGATIVE
Protein, ur: NEGATIVE mg/dL
Specific Gravity, Urine: 1.018 (ref 1.005–1.030)
pH: 6 (ref 5.0–8.0)

## 2018-03-18 LAB — LIPASE, BLOOD: Lipase: 19 U/L (ref 11–51)

## 2018-03-18 LAB — I-STAT BETA HCG BLOOD, ED (MC, WL, AP ONLY): I-stat hCG, quantitative: <5 m[IU]/mL (ref <5)

## 2018-03-18 MED ORDER — SODIUM CHLORIDE 0.9 % IV BOLUS
1000.0000 mL | Freq: Once | INTRAVENOUS | Status: AC
  Administered 2018-03-18: 1000 mL via INTRAVENOUS

## 2018-03-18 MED ORDER — KETOROLAC TROMETHAMINE 30 MG/ML IJ SOLN
30.0000 mg | Freq: Once | INTRAMUSCULAR | Status: AC
  Administered 2018-03-18: 30 mg via INTRAVENOUS
  Filled 2018-03-18: qty 1

## 2018-03-18 MED ORDER — LIDOCAINE VISCOUS HCL 2 % MT SOLN
15.0000 mL | Freq: Once | OROMUCOSAL | Status: AC
  Administered 2018-03-18: 15 mL via ORAL
  Filled 2018-03-18: qty 15

## 2018-03-18 MED ORDER — ONDANSETRON 4 MG PO TBDP: 4.0000 mg | ORAL_TABLET | Freq: Three times a day (TID) | ORAL | 0 refills | Status: DC | PRN

## 2018-03-18 MED ORDER — ALUM & MAG HYDROXIDE-SIMETH 200-200-20 MG/5ML PO SUSP
30.0000 mL | Freq: Once | ORAL | Status: AC
  Administered 2018-03-18: 30 mL via ORAL
  Filled 2018-03-18: qty 30

## 2018-03-18 MED ORDER — ONDANSETRON HCL 4 MG/2ML IJ SOLN
4.0000 mg | Freq: Once | INTRAMUSCULAR | Status: AC
  Administered 2018-03-18: 4 mg via INTRAVENOUS
  Filled 2018-03-18: qty 2

## 2018-03-18 NOTE — ED Provider Notes (Signed)
[facility] EMERGENCY DEPARTMENT Provider Note   CSN: [id number] Arrival date & time: 03/18/18  1051     History   Chief Complaint Chief Complaint  Patient presents with  . Emesis    HPI Carrie Cobb is a 38 y.o. female with history of GERD presents for evaluation of acute onset, persistent nausea vomiting diarrhea and abdominal pain beginning at around 8 AM this morning.  She reports she has had multiple episodes of nonbloody nonbilious emesis and watery nonbloody diarrhea.  She reports that pain is constant, worst in the upper abdomen.  Improves somewhat laying and resting.  Has not tried anything for her symptoms.  Denies fevers, chest pain, shortness of breath, urinary symptoms, melena, hematochezia, vaginal itching, bleeding, or discharge.  Denies suspicious food intake, recent travel, or recent treatment with antibiotics.  The history is provided by the patient.    Past Medical History:  Diagnosis Date  . GERD (gastroesophageal reflux disease)     Patient Active Problem List   Diagnosis Date Noted  . 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 DEPRESSION 06/17/2008  . 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 BACK STRAIN, LUMBAR 12/23/2006  . GERD 09/23/2006    Past Surgical History:  Procedure Laterality Date  . HEMORRHOID SURGERY       OB History   No obstetric history on file.      Home Medications    Prior to Admission medications   Medication Sig Start Date End Date Taking? Authorizing Provider  cyclobenzaprine (FLEXERIL) 10 MG tablet Take 1 tablet (10 mg total) by mouth 2 (two) times daily as needed for muscle spasms. 02/22/18   Jeannie Fend, PA-C  naproxen (NAPROSYN) 500 MG tablet Take 1 tablet (500 mg total) by mouth 2 (two) times daily with a meal. 02/22/18   Army Melia A, PA-C  ondansetron (ZOFRAN ODT) 4 MG disintegrating tablet Take 1 tablet (4 mg total) by mouth every 8 (eight) hours as needed for nausea or vomiting. 03/18/18   Therisa Mennella A, PA-C  ranitidine  (ZANTAC) 150 MG capsule Take 1 capsule (150 mg total) by mouth daily. 01/31/16   Nira Conn, MD    Family History No family history on file.  Social History Social History   Tobacco Use  . Smoking status: Former Smoker    Packs/day: 0.50  . Smokeless tobacco: Never Used  Substance Use Topics  . Alcohol use: No  . Drug use: Yes    Types: Marijuana     Allergies   Patient has no known allergies.   Review of Systems Review of Systems  Constitutional: Negative for chills and fever.  Respiratory: Negative for shortness of breath.   Cardiovascular: Negative for chest pain.  Gastrointestinal: Positive for abdominal pain, diarrhea, nausea and vomiting.  Genitourinary: Negative for dysuria, hematuria, vaginal bleeding, vaginal discharge and vaginal pain.  All other systems reviewed and are negative.    Physical Exam Updated Vital Signs BP 131/72   Pulse 60   Temp 98.5 F (36.9 C) (Oral)   Resp 18   Ht 5\' 8"  (1.727 m)   Wt 63.5 kg   LMP 02/18/2018   SpO2 99%   BMI 21.29 kg/m   Physical Exam Vitals signs and nursing note reviewed.  Constitutional:      General: She is not in acute distress.    Appearance: She is well-developed.  HENT:     Head: Normocephalic and atraumatic.  Eyes:     General:  Right eye: No discharge.        Left eye: No discharge.     Conjunctiva/sclera: Conjunctivae normal.  Neck:     Musculoskeletal: Normal range of motion and neck supple.     Vascular: No JVD.     Trachea: No tracheal deviation.  Cardiovascular:     Rate and Rhythm: Normal rate and regular rhythm.     Heart sounds: Normal heart sounds.  Pulmonary:     Effort: Pulmonary effort is normal.     Breath sounds: Normal breath sounds.  Abdominal:     General: Abdomen is flat. Bowel sounds are normal. There is no distension.     Palpations: Abdomen is soft.     Tenderness: There is generalized abdominal tenderness and tenderness in the epigastric area.  There is guarding. There is no right CVA tenderness or left CVA tenderness.     Comments: Maximally tender to palpation in the epigastric region  Skin:    General: Skin is warm and dry.     Findings: No erythema.  Neurological:     Mental Status: She is alert.  Psychiatric:        Behavior: Behavior normal.      ED Treatments / Results  Labs (all labs ordered are listed, but only abnormal results are displayed) Labs Reviewed  CBC WITH DIFFERENTIAL/PLATELET - Abnormal; Notable for the following components:      Result Value   Hemoglobin 10.2 (*)    HCT 32.9 (*)    MCH 25.4 (*)    RDW 15.6 (*)    All other components within normal limits  COMPREHENSIVE METABOLIC PANEL - Abnormal; Notable for the following components:   Glucose, Bld 112 (*)    Alkaline Phosphatase 37 (*)    All other components within normal limits  LIPASE, BLOOD  URINALYSIS, ROUTINE W REFLEX MICROSCOPIC  I-STAT BETA HCG BLOOD, ED ([facility], [facility], [facility] ONLY)    EKG None  Radiology No results found.  Procedures Procedures (including critical care time)  Medications Ordered in ED Medications  sodium chloride 0.9 % bolus 1,000 mL (0 mLs Intravenous Stopped 03/18/18 1223)  ondansetron (ZOFRAN) injection 4 mg (4 mg Intravenous Given 03/18/18 1153)  alum & mag hydroxide-simeth (MAALOX/MYLANTA) 200-200-20 MG/5ML suspension 30 mL (30 mLs Oral Given 03/18/18 1157)    And  lidocaine (XYLOCAINE) 2 % viscous mouth solution 15 mL (15 mLs Oral Given 03/18/18 1157)  ketorolac (TORADOL) 30 MG/ML injection 30 mg (30 mg Intravenous Given 03/18/18 1256)     Initial Impression / Assessment and Plan / ED Course  I have reviewed the triage vital signs and the nursing notes.  Pertinent labs & imaging results that were available during my care of the patient were reviewed by me and considered in my medical decision making (see chart for details).     Patient presenting for evaluation of acute onset nausea vomiting diarrhea and abdominal  pain beginning this morning.  She is afebrile, vital signs are stable.  No peritoneal signs on examination of the abdomen.  Significant improvement with Zofran, IV fluids, and GI cocktail.  Lab work reviewed by me shows no leukocytosis, mild anemia, no metabolic derangements.  LFTs, lipase, creatinine within normal limits.  She is not pregnant.  UA does not suggest UTI or nephrolithiasis.  Given nonfocal and nonsurgical examination of the abdomen and reassuring lab work, I do not feel she requires emergent imaging at this time.  Doubt obstruction, perforation, appendicitis, cholecystitis, or dissection.  Low suspicion of PID, TOA, ovarian torsion, or ectopic pregnancy in the absence of GU complaints or focal lower abdominal tenderness.  On reevaluation patient is resting comfortably no apparent distress.  She is tolerating p.o. fluids and serial abdominal examinations remain benign.  Suspect gastroenteritis, likely viral in nature.  Will discharge with Zofran.  Discussed advancing diet slowly, pushing fluids.  Recommend follow-up with PCP if symptoms persist.  Discussed strict ED return precautions. Pt verbalized understanding of and agreement with plan and is safe for discharge home at this time.   Final Clinical Impressions(s) / ED Diagnoses   Final diagnoses:  Nausea vomiting and diarrhea  Generalized abdominal pain    ED Discharge Orders         Ordered    ondansetron (ZOFRAN ODT) 4 MG disintegrating tablet  Every 8 hours PRN     03/18/18 1443           Jeanie Sewer, PA-C 03/18/18 1448    Eber Hong, MD 03/18/18 1926

## 2018-03-18 NOTE — ED Triage Notes (Signed)
Pt c/o n/v/d/ generalized abs pain  along with bilateral leg extremity that began this morning

## 2018-03-18 NOTE — ED Notes (Signed)
Patient provided with ginger ale, reports nausea has improved. Tolerated PO fluids well.

## 2018-03-18 NOTE — Discharge Instructions (Signed)
1. Medications: Take Zofran as needed for nausea.  Wait around 10-20 minutes before eating or drinking after taking this medication. 2. Treatment: rest, drink plenty of fluids, advance diet slowly.  Start with water and broth then advance to bland foods that will not upset your stomach such as crackers, mashed potatoes, and peanut butter. 3. Follow Up: Please followup with your primary doctor in 3 days for discussion of your diagnoses and further evaluation after today's visit; if you do not have a primary care doctor use the resource guide provided to find one; Please return to the ER for persistent vomiting, high fevers or worsening symptoms

## 2018-11-26 ENCOUNTER — Ambulatory Visit (HOSPITAL_COMMUNITY)
Admission: EM | Admit: 2018-11-26 | Discharge: 2018-11-26 | Disposition: A | Discharge status: Home / Self Care | Payer: Medicaid Other | Attending: Family Medicine | Admitting: Family Medicine

## 2018-11-26 ENCOUNTER — Encounter (HOSPITAL_COMMUNITY): Payer: Self-pay | Admitting: Emergency Medicine

## 2018-11-26 DIAGNOSIS — K0889 Other specified disorders of teeth and supporting structures: Secondary | ICD-10-CM

## 2018-11-26 MED ORDER — TRAMADOL HCL 50 MG PO TABS: 50.0000 mg | ORAL_TABLET | Freq: Four times a day (QID) | ORAL | 0 refills | Status: DC | PRN

## 2018-11-26 MED ORDER — PENICILLIN V POTASSIUM 500 MG PO TABS: 500.0000 mg | ORAL_TABLET | Freq: Three times a day (TID) | ORAL | 0 refills | Status: DC

## 2018-11-26 NOTE — Discharge Instructions (Signed)
Be aware, pain medications may cause drowsiness. Please do not drive, operate heavy machinery or make important decisions while on this medication, it can cloud your judgement.  

## 2018-11-26 NOTE — ED Triage Notes (Signed)
Pt c/o L lower dental pain "for a minute", states a couple days ago the tooth broke off and is unsure if anything is left over.

## 2018-11-26 NOTE — ED Provider Notes (Signed)
[facility]   [id number] 11/26/18 Arrival Time: 4098  ASSESSMENT & PLAN:  1. Pain, dental     No sign of abscess requiring I&D at this time. Discussed.  Meds ordered this encounter  Medications   penicillin v potassium (VEETID) 500 MG tablet    Sig: Take 1 tablet (500 mg total) by mouth 3 (three) times daily.    Dispense:  30 tablet    Refill:  0   traMADol (ULTRAM) 50 MG tablet    Sig: Take 1 tablet (50 mg total) by mouth every 6 (six) hours as needed.    Dispense:  15 tablet    Refill:  0    [place] Controlled Substances Registry consulted for this patient. I feel the risk/benefit ratio today is favorable for proceeding with this prescription for a controlled substance. Medication sedation precautions given.  Dental resource written instructions given. She will schedule dental evaluation as soon as possible if not improving over the next 24-48 hours.  Reviewed expectations re: course of current medical issues. Questions answered. Outlined signs and symptoms indicating need for more acute intervention. Patient verbalized understanding. After Visit Summary given.   SUBJECTIVE:  Carrie Cobb is a 38 y.o. female who reports gradual onset of left upper dental pain described as aching/throbbing without much hot/cold sensitivity. H/O pain in same area; "comes and goes but this time it's worse".. Present for 2-3 days; "think my tooth broke where it hurts". Fever: absent. Tolerating PO intake but reports pain with chewing. Normal swallowing. She does not see a dentist regularly. No neck swelling or pain. OTC analgesics without relief.  ROS: As per HPI.  OBJECTIVE: Vitals:   11/26/18 1322  BP: 104/67  Pulse: 92  Resp: 16  Temp: 98.7 F (37.1 C)  SpO2: 98%    General appearance: alert; no distress HENT: normocephalic; atraumatic; dentition: poor; left lower gums without areas of fluctuance, drainage, or bleeding and with tenderness to palpation; normal jaw  movement without difficulty Neck: supple without LAD; FROM; trachea midline Lungs: normal respirations; unlabored Skin: warm and dry Psychological: alert and cooperative; normal mood and affect  No Known Allergies  Past Medical History:  Diagnosis Date   GERD (gastroesophageal reflux disease)    Social History   Socioeconomic History   Marital status: Single    Spouse name: Not on file   Number of children: Not on file   Years of education: Not on file   Highest education level: Not on file  Occupational History   Not on file  Social Needs   Financial resource strain: Not on file   Food insecurity    Worry: Not on file    Inability: Not on file   Transportation needs    Medical: Not on file    Non-medical: Not on file  Tobacco Use   Smoking status: Former Smoker    Packs/day: 0.50   Smokeless tobacco: Never Used  Substance and Sexual Activity   Alcohol use: No   Drug use: Yes    Types: Marijuana   Sexual activity: Yes    Birth control/protection: None  Lifestyle   Physical activity    Days per week: Not on file    Minutes per session: Not on file   Stress: Not on file  Relationships   Social connections    Talks on phone: Not on file    Gets together: Not on file    Attends religious service: Not on file    Active  member of club or organization: Not on file    Attends meetings of clubs or organizations: Not on file    Relationship status: Not on file   Intimate partner violence    Fear of current or ex partner: Not on file    Emotionally abused: Not on file    Physically abused: Not on file    Forced sexual activity: Not on file  Other Topics Concern   Not on file  Social History Narrative   Not on file   No family history on file. Past Surgical History:  Procedure Laterality Date   HEMORRHOID SURGERY       Mardella Layman, MD 11/27/18 1222

## 2018-12-18 ENCOUNTER — Encounter (HOSPITAL_COMMUNITY): Payer: Self-pay | Admitting: Emergency Medicine

## 2018-12-18 ENCOUNTER — Other Ambulatory Visit: Payer: Self-pay

## 2018-12-18 ENCOUNTER — Ambulatory Visit (HOSPITAL_COMMUNITY)
Admission: EM | Admit: 2018-12-18 | Discharge: 2018-12-18 | Disposition: A | Discharge status: Home / Self Care | Payer: Medicaid Other | Attending: Internal Medicine | Admitting: Internal Medicine

## 2018-12-18 DIAGNOSIS — M791 Myalgia, unspecified site: Secondary | ICD-10-CM

## 2018-12-18 MED ORDER — TRAMADOL HCL 50 MG PO TABS: 50.0000 mg | ORAL_TABLET | Freq: Four times a day (QID) | ORAL | 0 refills | Status: AC | PRN

## 2018-12-18 MED ORDER — KETOROLAC TROMETHAMINE 30 MG/ML IJ SOLN
INTRAMUSCULAR | Status: AC
  Filled 2018-12-18: qty 1

## 2018-12-18 MED ORDER — IBUPROFEN 800 MG PO TABS: 800.0000 mg | ORAL_TABLET | Freq: Three times a day (TID) | ORAL | 0 refills | Status: AC

## 2018-12-18 MED ORDER — CYCLOBENZAPRINE HCL 5 MG PO TABS: 5.0000 mg | ORAL_TABLET | Freq: Three times a day (TID) | ORAL | 0 refills | Status: DC | PRN

## 2018-12-18 MED ORDER — KETOROLAC TROMETHAMINE 30 MG/ML IJ SOLN
30.0000 mg | Freq: Once | INTRAMUSCULAR | Status: AC
  Administered 2018-12-18: 15:00:00 30 mg via INTRAVENOUS
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
## 2018-12-27 ENCOUNTER — Encounter (HOSPITAL_COMMUNITY): Payer: Self-pay | Admitting: Emergency Medicine

## 2018-12-27 ENCOUNTER — Other Ambulatory Visit: Payer: Self-pay

## 2018-12-27 ENCOUNTER — Emergency Department (HOSPITAL_COMMUNITY)
Admission: EM | Admit: 2018-12-27 | Discharge: 2018-12-27 | Disposition: A | Discharge status: Home / Self Care | Payer: Medicaid Other | Attending: Emergency Medicine | Admitting: Emergency Medicine

## 2018-12-27 DIAGNOSIS — M5442 Lumbago with sciatica, left side: Secondary | ICD-10-CM | POA: Insufficient documentation

## 2018-12-27 DIAGNOSIS — Z87891 Personal history of nicotine dependence: Secondary | ICD-10-CM | POA: Insufficient documentation

## 2018-12-27 MED ORDER — MELOXICAM 7.5 MG PO TABS: 7.5000 mg | ORAL_TABLET | Freq: Two times a day (BID) | ORAL | 0 refills | Status: AC | PRN

## 2018-12-27 NOTE — ED Notes (Signed)
Out of bed to BR 
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
## 2018-12-27 NOTE — Discharge Instructions (Signed)
Please take the meloxicam, as prescribed.  Discontinue other NSAIDs such as ibuprofen.  Continue Flexeril, as prescribed.  Do not drive after taking Flexeril or drink alcohol as it can make you very drowsy.  Please return to the ED or seek medical attention for any new or worsening symptoms.
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
## 2018-12-27 NOTE — ED Notes (Signed)
Pt attempted to sign and computer went down to jump to hyper space delaying pt discharge6

## 2019-01-05 ENCOUNTER — Encounter: Payer: Self-pay | Admitting: Physician Assistant

## 2019-01-05 ENCOUNTER — Ambulatory Visit (INDEPENDENT_AMBULATORY_CARE_PROVIDER_SITE_OTHER): Payer: Self-pay

## 2019-01-05 ENCOUNTER — Other Ambulatory Visit: Payer: Self-pay

## 2019-01-05 ENCOUNTER — Ambulatory Visit (INDEPENDENT_AMBULATORY_CARE_PROVIDER_SITE_OTHER): Payer: Self-pay | Admitting: Physician Assistant

## 2019-01-05 DIAGNOSIS — M5442 Lumbago with sciatica, left side: Secondary | ICD-10-CM

## 2019-01-05 MED ORDER — METHYLPREDNISOLONE 4 MG PO TABS: ORAL_TABLET | ORAL | 0 refills | Status: AC

## 2019-01-05 MED ORDER — CYCLOBENZAPRINE HCL 5 MG PO TABS: 5.0000 mg | ORAL_TABLET | Freq: Three times a day (TID) | ORAL | 1 refills | Status: AC | PRN

## 2019-01-05 NOTE — Addendum Note (Signed)
Addended by: Michae Kava B on: 01/05/2019 02:43 PM   Modules accepted: Orders
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
## 2019-01-23 ENCOUNTER — Encounter: Payer: Self-pay | Admitting: Physical Therapy

## 2019-01-23 ENCOUNTER — Other Ambulatory Visit: Payer: Self-pay

## 2019-01-23 ENCOUNTER — Ambulatory Visit (INDEPENDENT_AMBULATORY_CARE_PROVIDER_SITE_OTHER): Payer: Self-pay | Admitting: Physical Therapy

## 2019-01-23 DIAGNOSIS — M545 Low back pain, unspecified: Secondary | ICD-10-CM

## 2019-01-23 DIAGNOSIS — M6281 Muscle weakness (generalized): Secondary | ICD-10-CM

## 2019-01-23 NOTE — Patient Instructions (Signed)
Access Code: [email or web address]  URL: [email or web address]  Date: 01/23/2019  Prepared by: Elsie Ra   Exercises  Seated Figure 4 Piriformis Stretch - 10 reps - 3 sets - 2x daily - 6x weekly  Supine Hamstring Stretch with Strap - 3 reps - 1 sets - 30 hold - 2x daily - 6x weekly  Single Knee to Chest Stretch - 2 reps - 1 sets - 30 hold - 2x daily - 6x weekly  Supine Lower Trunk Rotation - 10 reps - 1 sets - 5 sec hold - 2x daily - 6x weekly  Supine Bridge - 10 reps - 1-2 sets - 5 hold - 2x daily - 6x weekly  Supine Active Straight Leg Raise - 10 reps - 1-2 sets - 2x daily - 6x weekly  Lying Prone with 1 Pillow - 1 reps - 1 sets - 3-5 lminutes hold - 2x daily - 6x weekly  Patient Education  TENS Therapy
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
## 2019-02-13 ENCOUNTER — Encounter

## 2019-03-03 ENCOUNTER — Other Ambulatory Visit: Payer: Self-pay

## 2019-03-03 ENCOUNTER — Ambulatory Visit (INDEPENDENT_AMBULATORY_CARE_PROVIDER_SITE_OTHER): Payer: Self-pay | Admitting: Physical Therapy

## 2019-03-03 DIAGNOSIS — M545 Low back pain, unspecified: Secondary | ICD-10-CM

## 2019-03-03 DIAGNOSIS — M6281 Muscle weakness (generalized): Secondary | ICD-10-CM
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
## 2019-03-23 ENCOUNTER — Encounter: Payer: Self-pay | Admitting: Rehabilitative and Restorative Service Providers"

## 2019-03-23 ENCOUNTER — Other Ambulatory Visit: Payer: Self-pay

## 2019-03-23 ENCOUNTER — Ambulatory Visit (INDEPENDENT_AMBULATORY_CARE_PROVIDER_SITE_OTHER): Payer: Self-pay | Admitting: Rehabilitative and Restorative Service Providers"

## 2019-03-23 DIAGNOSIS — M545 Low back pain, unspecified: Secondary | ICD-10-CM

## 2019-03-23 DIAGNOSIS — M6281 Muscle weakness (generalized): Secondary | ICD-10-CM

## 2019-03-23 NOTE — Patient Instructions (Signed)
Access Code: [email or web address] URL: [email or web address] Date: 03/23/2019 Prepared by: Chyrel Masson  Exercises Supine Hamstring Stretch with Strap - 3 reps - 1 sets - 30 hold - 2x daily - 6x weekly Single Knee to Chest Stretch - 2 reps - 1 sets - 30 hold - 2x daily - 6x weekly Supine Lower Trunk Rotation - 5 reps - 1 sets - 15 hold - 2x daily - 6x weekly Supine Bridge - 10 reps - 1-2 sets - 5 hold - 2x daily - 6x weekly Supine Active Straight Leg Raise - 10 reps - 1-2 sets - 2x daily - 6x weekly Lying Prone with 1 Pillow - 1 reps - 1 sets - 3-5 lminutes hold - 2x daily - 6x weekly Dead Bug - 10 reps - 3 sets - 1x daily - 7x weekly Patient Education TENS Therapy
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
## 2019-03-27 ENCOUNTER — Encounter: Payer: Medicaid Other | Admitting: Physical Therapy

## 2019-03-30 ENCOUNTER — Encounter: Payer: Self-pay | Admitting: Physical Therapy

## 2019-04-02 ENCOUNTER — Encounter: Payer: Medicaid Other | Admitting: Physical Therapy

## 2019-04-07 ENCOUNTER — Encounter: Payer: Medicaid Other | Admitting: Rehabilitative and Restorative Service Providers"

## 2019-04-10 ENCOUNTER — Encounter: Payer: Self-pay | Admitting: Rehabilitative and Restorative Service Providers"

## 2019-04-10 ENCOUNTER — Other Ambulatory Visit: Payer: Self-pay

## 2019-04-10 ENCOUNTER — Ambulatory Visit (INDEPENDENT_AMBULATORY_CARE_PROVIDER_SITE_OTHER): Payer: Self-pay | Admitting: Rehabilitative and Restorative Service Providers"

## 2019-04-10 DIAGNOSIS — M6281 Muscle weakness (generalized): Secondary | ICD-10-CM

## 2019-04-10 DIAGNOSIS — M545 Low back pain, unspecified: Secondary | ICD-10-CM
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
## 2019-04-14 ENCOUNTER — Telehealth: Payer: Self-pay | Admitting: Rehabilitative and Restorative Service Providers"

## 2019-04-14 ENCOUNTER — Encounter: Payer: Medicaid Other | Admitting: Rehabilitative and Restorative Service Providers"
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
## 2019-04-17 ENCOUNTER — Encounter: Payer: Medicaid Other | Admitting: Rehabilitative and Restorative Service Providers"

## 2019-04-17 ENCOUNTER — Other Ambulatory Visit: Payer: Self-pay

## 2019-04-17 ENCOUNTER — Telehealth: Payer: Self-pay | Admitting: Rehabilitative and Restorative Service Providers"

## 2019-04-17 NOTE — Telephone Encounter (Signed)
Called Pt. After 15 mins past appointment time.  No answer and voice message box was not set up for leaving message.  Chyrel Masson, PT, DPT, OCS, ATC 04/17/19  2:17 PM

## 2019-04-21 ENCOUNTER — Other Ambulatory Visit: Payer: Self-pay

## 2019-04-21 ENCOUNTER — Ambulatory Visit (INDEPENDENT_AMBULATORY_CARE_PROVIDER_SITE_OTHER): Payer: Self-pay | Admitting: Rehabilitative and Restorative Service Providers"

## 2019-04-21 ENCOUNTER — Encounter: Payer: Self-pay | Admitting: Rehabilitative and Restorative Service Providers"

## 2019-04-21 DIAGNOSIS — M545 Low back pain, unspecified: Secondary | ICD-10-CM

## 2019-04-21 DIAGNOSIS — M6281 Muscle weakness (generalized): Secondary | ICD-10-CM
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
## 2019-04-24 ENCOUNTER — Encounter: Payer: Self-pay | Admitting: Physical Therapy

## 2019-04-28 ENCOUNTER — Encounter: Payer: Medicaid Other | Admitting: Rehabilitative and Restorative Service Providers"

## 2019-05-01 ENCOUNTER — Other Ambulatory Visit: Payer: Self-pay

## 2019-05-01 ENCOUNTER — Ambulatory Visit (INDEPENDENT_AMBULATORY_CARE_PROVIDER_SITE_OTHER): Payer: Self-pay | Admitting: Rehabilitative and Restorative Service Providers"

## 2019-05-01 ENCOUNTER — Encounter: Payer: Self-pay | Admitting: Rehabilitative and Restorative Service Providers"

## 2019-05-01 DIAGNOSIS — M6281 Muscle weakness (generalized): Secondary | ICD-10-CM

## 2019-05-01 DIAGNOSIS — M545 Low back pain, unspecified: Secondary | ICD-10-CM
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
## 2019-05-11 ENCOUNTER — Other Ambulatory Visit: Payer: Self-pay

## 2019-05-25 ENCOUNTER — Telehealth: Payer: Self-pay | Admitting: Physician Assistant

## 2019-05-25 NOTE — Telephone Encounter (Signed)
Records & bills refaxed at[facility] [phone], originally faxed 05/07/19

## 2023-02-26 ENCOUNTER — Encounter: Payer: Self-pay | Admitting: Neurology

## 2023-02-26 ENCOUNTER — Ambulatory Visit: Payer: Medicaid Other | Admitting: Neurology
